# Patient Record
Sex: Female | Born: 1970 | Race: White | Hispanic: No | Marital: Married | Smoking: Never smoker
Health system: Southern US, Community
[De-identification: ages and names within clinical notes are randomized; demographics above are authoritative.]

## PROBLEM LIST (undated history)

## (undated) DIAGNOSIS — D869 Sarcoidosis, unspecified: Secondary | ICD-10-CM

## (undated) DIAGNOSIS — N809 Endometriosis, unspecified: Secondary | ICD-10-CM

## (undated) HISTORY — DX: Endometriosis, unspecified: N80.9

## (undated) HISTORY — PX: TONSILECTOMY, ADENOIDECTOMY, BILATERAL MYRINGOTOMY AND TUBES: SHX2538

---

## 2005-04-02 ENCOUNTER — Inpatient Hospital Stay: Payer: Self-pay

## 2007-09-13 ENCOUNTER — Inpatient Hospital Stay: Payer: Self-pay | Admitting: Unknown Physician Specialty

## 2015-02-25 ENCOUNTER — Emergency Department: Payer: BLUE CROSS/BLUE SHIELD

## 2015-02-25 ENCOUNTER — Encounter: Payer: Self-pay | Admitting: Emergency Medicine

## 2015-02-25 ENCOUNTER — Emergency Department
Admission: EM | Admit: 2015-02-25 | Discharge: 2015-02-25 | Disposition: A | Discharge status: Home / Self Care | Payer: BLUE CROSS/BLUE SHIELD | Attending: Emergency Medicine | Admitting: Emergency Medicine

## 2015-02-25 ENCOUNTER — Other Ambulatory Visit: Payer: Self-pay

## 2015-02-25 DIAGNOSIS — N390 Urinary tract infection, site not specified: Secondary | ICD-10-CM | POA: Diagnosis not present

## 2015-02-25 DIAGNOSIS — K529 Noninfective gastroenteritis and colitis, unspecified: Secondary | ICD-10-CM

## 2015-02-25 DIAGNOSIS — Z3202 Encounter for pregnancy test, result negative: Secondary | ICD-10-CM | POA: Insufficient documentation

## 2015-02-25 DIAGNOSIS — Z79899 Other long term (current) drug therapy: Secondary | ICD-10-CM | POA: Diagnosis not present

## 2015-02-25 DIAGNOSIS — A09 Infectious gastroenteritis and colitis, unspecified: Secondary | ICD-10-CM

## 2015-02-25 DIAGNOSIS — R1031 Right lower quadrant pain: Secondary | ICD-10-CM

## 2015-02-25 HISTORY — DX: Sarcoidosis, unspecified: D86.9

## 2015-02-25 LAB — URINALYSIS COMPLETE WITH MICROSCOPIC (ARMC ONLY)
Bilirubin Urine: NEGATIVE
GLUCOSE, UA: NEGATIVE mg/dL
Nitrite: NEGATIVE
PROTEIN: 30 mg/dL — AB
Specific Gravity, Urine: 1.019 (ref 1.005–1.030)
pH: 5 (ref 5.0–8.0)

## 2015-02-25 LAB — LIPASE, BLOOD: Lipase: 37 U/L (ref 22–51)

## 2015-02-25 LAB — COMPREHENSIVE METABOLIC PANEL
ALBUMIN: 4.7 g/dL (ref 3.5–5.0)
ALT: 12 U/L — ABNORMAL LOW (ref 14–54)
ANION GAP: 12 (ref 5–15)
AST: 17 U/L (ref 15–41)
Alkaline Phosphatase: 43 U/L (ref 38–126)
BUN: 9 mg/dL (ref 6–20)
CO2: 25 mmol/L (ref 22–32)
CREATININE: 0.8 mg/dL (ref 0.44–1.00)
Calcium: 9 mg/dL (ref 8.9–10.3)
Chloride: 100 mmol/L — ABNORMAL LOW (ref 101–111)
GFR calc Af Amer: >60 mL/min (ref >60)
GFR calc non Af Amer: >60 mL/min (ref >60)
Glucose, Bld: 99 mg/dL (ref 65–99)
POTASSIUM: 3.3 mmol/L — AB (ref 3.5–5.1)
SODIUM: 137 mmol/L (ref 135–145)
TOTAL PROTEIN: 7.8 g/dL (ref 6.5–8.1)
Total Bilirubin: 0.8 mg/dL (ref 0.3–1.2)

## 2015-02-25 LAB — CBC WITH DIFFERENTIAL/PLATELET
Basophils Absolute: 0 10*3/uL (ref 0–0.1)
Basophils Relative: 0 %
Eosinophils Absolute: 0 10*3/uL (ref 0–0.7)
Eosinophils Relative: 0 %
HCT: 39.4 % (ref 35.0–47.0)
Hemoglobin: 13.6 g/dL (ref 12.0–16.0)
LYMPHS PCT: 4 %
Lymphs Abs: 0.4 10*3/uL — ABNORMAL LOW (ref 1.0–3.6)
MCH: 31.6 pg (ref 26.0–34.0)
MCHC: 34.4 g/dL (ref 32.0–36.0)
MCV: 91.8 fL (ref 80.0–100.0)
Monocytes Absolute: 0.6 10*3/uL (ref 0.2–0.9)
Monocytes Relative: 6 %
NEUTROS ABS: 8.4 10*3/uL — AB (ref 1.4–6.5)
Neutrophils Relative %: 90 %
Platelets: 294 10*3/uL (ref 150–440)
RBC: 4.29 MIL/uL (ref 3.80–5.20)
RDW: 12.5 % (ref 11.5–14.5)
WBC: 9.4 10*3/uL (ref 3.6–11.0)

## 2015-02-25 LAB — POCT PREGNANCY, URINE: PREG TEST UR: NEGATIVE

## 2015-02-25 MED ORDER — ONDANSETRON HCL 4 MG/2ML IJ SOLN
INTRAMUSCULAR | Status: AC
  Filled 2015-02-25: qty 2

## 2015-02-25 MED ORDER — MORPHINE SULFATE 4 MG/ML IJ SOLN
4.0000 mg | Freq: Once | INTRAMUSCULAR | Status: AC
Start: 2015-02-25 — End: 2015-02-25
  Administered 2015-02-25: 4 mg via INTRAVENOUS

## 2015-02-25 MED ORDER — ONDANSETRON HCL 4 MG/2ML IJ SOLN
4.0000 mg | Freq: Once | INTRAMUSCULAR | Status: AC
  Administered 2015-02-25: 4 mg via INTRAVENOUS

## 2015-02-25 MED ORDER — SODIUM CHLORIDE 0.9 % IV SOLN
1000.0000 mL | Freq: Once | INTRAVENOUS | Status: AC
  Administered 2015-02-25: 1000 mL via INTRAVENOUS

## 2015-02-25 MED ORDER — CIPROFLOXACIN HCL 500 MG PO TABS: 500.0000 mg | ORAL_TABLET | Freq: Two times a day (BID) | ORAL | Status: DC

## 2015-02-25 MED ORDER — MORPHINE SULFATE 4 MG/ML IJ SOLN
INTRAMUSCULAR | Status: AC
  Filled 2015-02-25: qty 1

## 2015-02-25 MED ORDER — IOHEXOL 240 MG/ML SOLN
25.0000 mL | Freq: Once | INTRAMUSCULAR | Status: AC | PRN
  Administered 2015-02-25: 25 mL via ORAL

## 2015-02-25 MED ORDER — IOHEXOL 300 MG/ML  SOLN
100.0000 mL | Freq: Once | INTRAMUSCULAR | Status: AC | PRN
  Administered 2015-02-25: 100 mL via INTRAVENOUS

## 2015-02-25 MED ORDER — ONDANSETRON 4 MG PO TBDP
4.0000 mg | ORAL_TABLET | Freq: Once | ORAL | Status: AC
  Administered 2015-02-25: 4 mg via ORAL

## 2015-02-25 MED ORDER — SODIUM CHLORIDE 0.9 % IV BOLUS (SEPSIS)
1000.0000 mL | Freq: Once | INTRAVENOUS | Status: AC
  Administered 2015-02-25: 1000 mL via INTRAVENOUS

## 2015-02-25 MED ORDER — ONDANSETRON 4 MG PO TBDP
ORAL_TABLET | ORAL | Status: AC
  Filled 2015-02-25: qty 1

## 2015-02-25 MED ORDER — HYDROCODONE-ACETAMINOPHEN 5-325 MG PO TABS: 1.0000 | ORAL_TABLET | ORAL | Status: DC | PRN

## 2015-02-25 NOTE — Discharge Instructions (Signed)

## 2015-02-25 NOTE — ED Provider Notes (Signed)
[facility] Emergency Department Provider Note  ____________________________________________  Time seen: 5 PM  I have reviewed the triage vital signs and the nursing notes.   HISTORY  Chief Complaint Abdominal Pain      HPI Dawn Cook is a 44 y.o. female who presents with  right-sided abdominal pain. The pain began yesterday and was primarily in the right upper quadrant and was severe. It began after eating. She has had nausea and vomiting. No fevers or chills. Decreased appetite. The pain became more in the right lower quadrant today. No history of abdominal surgery.     Past Medical History  Diagnosis Date  . Sarcoidosis     There are no active problems to display for this patient.   History reviewed. No pertinent past surgical history.  Current Outpatient Rx  Name  Route  Sig  Dispense  Refill  . Norethin Ace-Eth Estrad-FE 1-20 MG-MCG(24) CHEW   Oral   Chew 1 tablet by mouth daily.           Allergies Review of patient's allergies indicates no known allergies.  No family history on file.  Social History History  Substance Use Topics  . Smoking status: Never Smoker   . Smokeless tobacco: Never Used  . Alcohol Use: Yes    Review of Systems  Constitutional: Negative for fever. Eyes: Negative for visual changes. ENT: Negative for sore throat. Cardiovascular: Negative for chest pain. Respiratory: Negative for shortness of breath. Gastrointestinal: Negative for abdominal pain, vomiting and diarrhea. Genitourinary: Negative for dysuria. Musculoskeletal: Negative for back pain. Skin: Negative for rash. Neurological: Negative for headaches, focal weakness or numbness. Psychiatric: No anxiety  10-point ROS otherwise negative.  ____________________________________________   PHYSICAL EXAM:  VITAL SIGNS: ED Triage Vitals  Enc Vitals Group     BP 02/25/15 1554 127/72 mmHg     Pulse Rate 02/25/15 1554 111     Resp  02/25/15 1554 16     Temp 02/25/15 1554 98.4 F (36.9 C)     Temp Source 02/25/15 1554 Oral     SpO2 02/25/15 1554 100 %     Weight 02/25/15 1554 127 lb (57.607 kg)     Height 02/25/15 1554 5' 10"  (1.778 m)     Head Cir --      Peak Flow --      Pain Score 02/25/15 1555 7     Pain Loc --      Pain Edu? --      Excl. in [place]? --      Constitutional: Alert and oriented. Well appearing and in no distress. Eyes: Conjunctivae are normal. PERRL. Normal extraocular movements. ENT   Head: Normocephalic and atraumatic.   Nose: No congestion/rhinnorhea.   Mouth/Throat: Mucous membranes are moist.   Neck: No stridor. Hematological/Lymphatic/Immunilogical: No cervical lymphadenopathy. Cardiovascular: Normal rate, regular rhythm. Normal and symmetric distal pulses are present in all extremities. No murmurs, rubs, or gallops. Respiratory: Normal respiratory effort without tachypnea nor retractions. Breath sounds are clear and equal bilaterally. No wheezes/rales/rhonchi. Gastrointestinal: Significant tenderness palpation at McBurney's point. No distention. There is no CVA tenderness. Genitourinary: deferred Musculoskeletal: Nontender with normal range of motion in all extremities. No joint effusions.  No lower extremity tenderness nor edema. Neurologic:  Normal speech and language. No gross focal neurologic deficits are appreciated. Speech is normal.  Skin:  Skin is warm, dry and intact. No rash noted. Psychiatric: Mood and affect are normal. Speech and behavior are normal. Patient exhibits appropriate  insight and judgment.  ____________________________________________    LABS (pertinent positives/negatives)  Unremarkable  ____________________________________________   EKG  ED ECG REPORT   Date: 02/25/2015  EKG Time: 4:20 PM  Rate: 103  Rhythm: sinus tachycardia  Axis: Normal  Intervals:none  ST&T Change: Normal   ____________________________________________     RADIOLOGY  CT abdomen and pelvis negative  ____________________________________________   PROCEDURES  Procedure(s) performed: None  Critical Care performed: None    ____________________________________________   INITIAL IMPRESSION / ASSESSMENT AND PLAN / ED COURSE  Pertinent labs & imaging results that were available during my care of the patient were reviewed by me and considered in my medical decision making (see chart for details).  Initial impression based on physical exam and history of present illness consistent for acute appendicitis versus biliary colic. We'll obtain CT abdomen and pelvis and give IV analgesics and antiemetics.  ----------------------------------------- 7:37 PM on 02/25/2015 -----------------------------------------  CT abdomen and pelvis normal. Patient feels well. We'll discharge with antibiotics for urinary tract infection and colitis. Patient will follow-up with PCP as needed. Return precautions given  ____________________________________________   FINAL CLINICAL IMPRESSION(S) / ED DIAGNOSES  Final diagnoses:  Colitis  Right lower quadrant abdominal pain  Traveler's diarrhea  Urinary tract infection without hematuria, site unspecified     Lavonia Drafts, MD 02/25/15 2011

## 2015-02-25 NOTE — ED Notes (Signed)
Pt transported to CT ?

## 2015-02-25 NOTE — ED Notes (Signed)
Patient to ER with c/o RUQ abdominal pain that is dull intermittently with sharpness at times.

## 2017-07-04 ENCOUNTER — Ambulatory Visit
Admission: EM | Admit: 2017-07-04 | Discharge: 2017-07-04 | Disposition: A | Discharge status: Home / Self Care | Payer: BLUE CROSS/BLUE SHIELD

## 2017-07-13 ENCOUNTER — Telehealth: Payer: Self-pay

## 2017-07-13 ENCOUNTER — Other Ambulatory Visit: Payer: Self-pay | Admitting: Obstetrics & Gynecology

## 2017-07-13 MED ORDER — NORETHIN ACE-ETH ESTRAD-FE 1-20 MG-MCG(24) PO CHEW: 1.0000 | CHEWABLE_TABLET | Freq: Every day | ORAL | 0 refills | Status: DC

## 2017-07-13 NOTE — Telephone Encounter (Signed)
Pt aware bc refill eRx'd.

## 2017-07-13 NOTE — Telephone Encounter (Signed)
Pt needs refill of birthcontrol by tomorrow. [phone]

## 2017-07-13 NOTE — Telephone Encounter (Signed)
Pt is schedule 07/31/17 with JC

## 2017-07-30 ENCOUNTER — Encounter: Payer: Self-pay | Admitting: Obstetrics and Gynecology

## 2017-07-30 ENCOUNTER — Ambulatory Visit (INDEPENDENT_AMBULATORY_CARE_PROVIDER_SITE_OTHER): Payer: BLUE CROSS/BLUE SHIELD | Admitting: Obstetrics and Gynecology

## 2017-07-30 VITALS — BP 124/78 | HR 59 | Ht 70.0 in | Wt 137.1 lb

## 2017-07-30 DIAGNOSIS — Z01419 Encounter for gynecological examination (general) (routine) without abnormal findings: Secondary | ICD-10-CM | POA: Diagnosis not present

## 2017-07-30 DIAGNOSIS — Z808 Family history of malignant neoplasm of other organs or systems: Secondary | ICD-10-CM

## 2017-07-30 DIAGNOSIS — Z0000 Encounter for general adult medical examination without abnormal findings: Secondary | ICD-10-CM

## 2017-07-30 DIAGNOSIS — Z8049 Family history of malignant neoplasm of other genital organs: Secondary | ICD-10-CM | POA: Diagnosis not present

## 2017-07-30 DIAGNOSIS — Z800 Family history of malignant neoplasm of digestive organs: Secondary | ICD-10-CM | POA: Diagnosis not present

## 2017-07-30 DIAGNOSIS — Z803 Family history of malignant neoplasm of breast: Secondary | ICD-10-CM

## 2017-07-30 MED ORDER — JUNEL FE 1/20 1-20 MG-MCG PO TABS: 1.0000 | ORAL_TABLET | Freq: Every day | ORAL | 3 refills | Status: DC

## 2017-07-30 NOTE — Progress Notes (Signed)
HPI:      [name] is a 46 y.o. [phone] who LMP was Patient's last menstrual period was 07/02/2017 (approximate).  Subjective:   She presents today for her annual examination.  She is currently taking OCPs having normal regular cycles and would like to continue. She has no complaints. Upon questioning she does have a significant family history for cancer. Close relatives have brain cancer, endometrial cancer, breast cancer, and colon cancer.    Hx: The following portions of the patient's history were reviewed and updated as appropriate:             She  has a past medical history of Endometriosis and Sarcoidosis. She  does not have a problem list on file. She  has no past surgical history on file. Her family history includes Breast cancer in her mother; Cancer in her maternal grandmother, maternal uncle, paternal aunt, paternal grandfather, paternal grandmother, and paternal uncle; Endometrial cancer in her mother. She  reports that she has never smoked. She has never used smokeless tobacco. She reports that she drinks alcohol. She reports that she does not use drugs. She has No Known Allergies.       Review of Systems:  Review of Systems  Constitutional: Denied constitutional symptoms, night sweats, recent illness, fatigue, fever, insomnia and weight loss.  Eyes: Denied eye symptoms, eye pain, photophobia, vision change and visual disturbance.  Ears/Nose/Throat/Neck: Denied ear, nose, throat or neck symptoms, hearing loss, nasal discharge, sinus congestion and sore throat.  Cardiovascular: Denied cardiovascular symptoms, arrhythmia, chest pain/pressure, edema, exercise intolerance, orthopnea and palpitations.  Respiratory: Denied pulmonary symptoms, asthma, pleuritic pain, productive sputum, cough, dyspnea and wheezing.  Gastrointestinal: Denied, gastro-esophageal reflux, melena, nausea and vomiting.  Genitourinary: Denied genitourinary symptoms including symptomatic vaginal  discharge, pelvic relaxation issues, and urinary complaints.  Musculoskeletal: Denied musculoskeletal symptoms, stiffness, swelling, muscle weakness and myalgia.  Dermatologic: Denied dermatology symptoms, rash and scar.  Neurologic: Denied neurology symptoms, dizziness, headache, neck pain and syncope.  Psychiatric: Denied psychiatric symptoms, anxiety and depression.  Endocrine: Denied endocrine symptoms including hot flashes and night sweats.   Meds:   No current outpatient prescriptions on file prior to visit.   No current facility-administered medications on file prior to visit.     Objective:     Vitals:   07/30/17 1027  BP: 124/78  Pulse: (!) 59              Physical examination General NAD, Conversant  HEENT Atraumatic; Op clear with mmm.  Normo-cephalic. Pupils reactive. Anicteric sclerae  Thyroid/Neck Smooth without nodularity or enlargement. Normal ROM.  Neck Supple.  Skin No rashes, lesions or ulceration. Normal palpated skin turgor. No nodularity.  Breasts: No masses or discharge.  Symmetric.  No axillary adenopathy.  Lungs: Clear to auscultation.No rales or wheezes. Normal Respiratory effort, no retractions.  Heart: NSR.  No murmurs or rubs appreciated. No periferal edema  Abdomen: Soft.  Non-tender.  No masses.  No HSM. No hernia  Extremities: Moves all appropriately.  Normal ROM for age. No lymphadenopathy.  Neuro: Oriented to PPT.  Normal mood. Normal affect.     Pelvic:   Vulva: Normal appearance.  No lesions.  Vagina: No lesions or abnormalities noted.  Support: Normal pelvic support.  Urethra No masses tenderness or scarring.  Meatus Normal size without lesions or prolapse.  Cervix: Normal appearance.  No lesions.  Anus: Normal exam.  No lesions.  Perineum: Normal exam.  No lesions.  Bimanual   Uterus: Normal size.  Non-tender.  Mobile.  AV.  Adnexae: No masses.  Non-tender to palpation.  Cul-de-sac: Negative for abnormality.       Assessment:    [id number] There are no active problems to display for this patient.    1. Encounter for annual physical exam   2. Family history of brain cancer   3. Family history of breast cancer   4. Family history of colon cancer   5. Family history of genital cancer     Patient with no current problems but a significant family history of cancer.   Plan:            1.  Basic Screening Recommendations The basic screening recommendations for asymptomatic women were discussed with the patient during her visit.  The age-appropriate recommendations were discussed with her and the rational for the tests reviewed.  When I am informed by the patient that another primary care physician has previously obtained the age-appropriate tests and they are up-to-date, only outstanding tests are ordered and referrals given as necessary.  Abnormal results of tests will be discussed with her when all of her results are completed. Pap performed  2.  We have discussed genetic testing for cancer genes and I have given her several pamphlets of literature to review and discuss with her insurance company. She will inform us when she has reached a decision. 3.  Mammogram ordered  Orders Orders Placed This Encounter  Procedures  . MM DIGITAL SCREENING BILATERAL  . Glucose, random  . TSH  . Lipid panel     Meds ordered this encounter  Medications  . JUNEL FE 1/20 1-20 MG-MCG tablet    Sig: Take 1 tablet by mouth daily.    Dispense:  3 Package    Refill:  3        F/U  No Follow-up on file.  Finis Bud, M.D. 07/30/2017 11:44 AM

## 2017-07-30 NOTE — Addendum Note (Signed)
Addended by: Raliegh Ip on: 07/30/2017 12:48 PM   Modules accepted: Orders

## 2017-07-31 ENCOUNTER — Ambulatory Visit: Payer: Self-pay | Admitting: Maternal Newborn

## 2017-08-03 LAB — PAP IG AND HPV HIGH-RISK
HPV, high-risk: NEGATIVE
PAP Smear Comment: 0

## 2017-08-06 ENCOUNTER — Other Ambulatory Visit: Payer: BLUE CROSS/BLUE SHIELD

## 2017-08-07 LAB — LIPID PANEL
CHOL/HDL RATIO: 3.6 ratio (ref 0.0–4.4)
Cholesterol, Total: 196 mg/dL (ref 100–199)
HDL: 54 mg/dL (ref >39)
LDL CALC: 125 mg/dL — AB (ref 0–99)
Triglycerides: 85 mg/dL (ref 0–149)
VLDL Cholesterol Cal: 17 mg/dL (ref 5–40)

## 2017-08-07 LAB — GLUCOSE, RANDOM: GLUCOSE: 93 mg/dL (ref 65–99)

## 2017-08-07 LAB — TSH: TSH: 2.87 u[IU]/mL (ref 0.450–4.500)

## 2017-08-11 ENCOUNTER — Telehealth: Payer: Self-pay

## 2017-08-11 NOTE — Telephone Encounter (Signed)
-----   Message from Harlin Heys, MD sent at 08/04/2017  4:11 PM EDT ----- Negative Pap and HPV

## 2017-08-11 NOTE — Telephone Encounter (Signed)
Message left on pts voicemail- neg test results per provider

## 2018-04-12 ENCOUNTER — Encounter: Payer: Self-pay | Admitting: Urology

## 2018-04-12 ENCOUNTER — Ambulatory Visit: Payer: BLUE CROSS/BLUE SHIELD | Admitting: Urology

## 2018-04-12 VITALS — BP 114/75 | HR 70 | Ht 70.0 in | Wt 136.2 lb

## 2018-04-12 DIAGNOSIS — R319 Hematuria, unspecified: Secondary | ICD-10-CM | POA: Diagnosis not present

## 2018-04-12 LAB — URINALYSIS, COMPLETE
Bilirubin, UA: NEGATIVE
Glucose, UA: NEGATIVE
Ketones, UA: NEGATIVE
LEUKOCYTES UA: NEGATIVE
Nitrite, UA: NEGATIVE
Protein, UA: NEGATIVE
Specific Gravity, UA: <1.005 — ABNORMAL LOW (ref 1.005–1.030)
Urobilinogen, Ur: 0.2 mg/dL (ref 0.2–1.0)
pH, UA: 6.5 (ref 5.0–7.5)

## 2018-04-12 LAB — MICROSCOPIC EXAMINATION

## 2018-04-12 NOTE — Progress Notes (Signed)
04/12/2018 1:26 PM   Dawn Cook 04-06-1971 [id number]  Referring provider: Clinic, [place] [address] [place], [place] [address]  Chief Complaint  Patient presents with  . Hematuria    HPI I was consulted to assist the patient's microscopic hematuria.  She voids every 2 or 3 hours.  She has uncommon stress incontinence not wearing a pad.  She gets up once a night.  She has never smoked.  She does not take daily aspirin or blood thinners.  She has not had a hysterectomy.  She is not a diabetic.  Bowel function normal.  Presentation has not been medically treated.  She has not had previous GU surgery or kidney stones  Modifying factors: There are no other modifying factors  Associated signs and symptoms: There are no other associated signs and symptoms Aggravating and relieving factors: There are no other aggravating or relieving factors Severity: Moderate Duration: Persistent      PMH: Past Medical History:  Diagnosis Date  . Endometriosis   . Sarcoidosis     Surgical History: Past Surgical History:  Procedure Laterality Date  . TONSILECTOMY, ADENOIDECTOMY, BILATERAL MYRINGOTOMY AND TUBES      Home Medications:  Allergies as of 04/12/2018   No Known Allergies     Medication List        Accurate as of 04/12/18  1:26 PM. Always use your most recent med list.          azelastine 0.1 % nasal spray Commonly known as:  ASTELIN Place into the nose.   Fish Oil 1000 MG Caps Take by mouth.   JUNEL FE 1/20 1-20 MG-MCG tablet Generic drug:  norethindrone-ethinyl estradiol Take 1 tablet by mouth daily.       Allergies: No Known Allergies  Family History: Family History  Problem Relation Age of Onset  . Breast cancer Mother   . Endometrial cancer Mother   . Cancer Maternal Uncle   . Cancer Paternal Aunt   . Cancer Paternal Uncle   . Cancer Maternal Grandmother   . Cancer Paternal Grandmother   . Cancer Paternal Grandfather   . Bladder  Cancer Neg Hx   . Kidney cancer Neg Hx     Social History:  reports that she has never smoked. She has never used smokeless tobacco. She reports that she drinks alcohol. She reports that she does not use drugs.  ROS: UROLOGY Frequent Urination?: No Hard to postpone urination?: No Burning/pain with urination?: No Get up at night to urinate?: Yes Leakage of urine?: No Urine stream starts and stops?: No Trouble starting stream?: No Do you have to strain to urinate?: No Blood in urine?: Yes Urinary tract infection?: No Sexually transmitted disease?: No Injury to kidneys or bladder?: No Painful intercourse?: Yes Weak stream?: No Currently pregnant?: No Vaginal bleeding?: No Last menstrual period?: 04/05/18  Gastrointestinal Nausea?: No Vomiting?: No Indigestion/heartburn?: No Diarrhea?: No Constipation?: No  Constitutional Fever: No Night sweats?: No Weight loss?: No Fatigue?: No  Skin Skin rash/lesions?: No Itching?: No  Eyes Blurred vision?: No Double vision?: No  Ears/Nose/Throat Sore throat?: No Sinus problems?: No  Hematologic/Lymphatic Swollen glands?: No Easy bruising?: No  Cardiovascular Leg swelling?: No Chest pain?: No  Respiratory Cough?: No Shortness of breath?: No  Endocrine Excessive thirst?: No  Musculoskeletal Back pain?: No Joint pain?: No  Neurological Headaches?: No Dizziness?: No  Psychologic Depression?: No Anxiety?: No  Physical Exam: BP 114/75 (BP Location: Right Arm, Patient Position: Sitting, Cuff Size:  Normal)   Pulse 70   Ht 5' 10"  (1.778 m)   Wt 136 lb 3.2 oz (61.8 kg)   BMI 19.54 kg/m   Constitutional:  Alert and oriented, No acute distress. HEENT: [place] AT, moist mucus membranes.  Trachea midline, no masses. Cardiovascular: No clubbing, cyanosis, or edema. Respiratory: Normal respiratory effort, no increased work of breathing. GI: Abdomen is soft, nontender, nondistended, no abdominal masses GU: No CVA  tenderness.  No bladder tenderness Skin: No rashes, bruises or suspicious lesions. Lymph: No cervical or inguinal adenopathy. Neurologic: Grossly intact, no focal deficits, moving all 4 extremities. Psychiatric: Normal mood and affect.  Laboratory Data: Lab Results  Component Value Date   WBC 9.4 02/25/2015   HGB 13.6 02/25/2015   HCT 39.4 02/25/2015   MCV 91.8 02/25/2015   PLT 294 02/25/2015    Lab Results  Component Value Date   CREATININE 0.80 02/25/2015    No results found for: PSA  No results found for: TESTOSTERONE  No results found for: HGBA1C  Urinalysis    Component Value Date/Time   COLORURINE YELLOW (A) 02/25/2015 1635   APPEARANCEUR HAZY (A) 02/25/2015 1635   LABSPEC 1.019 02/25/2015 1635   PHURINE 5.0 02/25/2015 1635   GLUCOSEU NEGATIVE 02/25/2015 1635   HGBUR 2+ (A) 02/25/2015 1635   BILIRUBINUR NEGATIVE 02/25/2015 1635   KETONESUR 2+ (A) 02/25/2015 1635   PROTEINUR 30 (A) 02/25/2015 1635   NITRITE NEGATIVE 02/25/2015 1635   LEUKOCYTESUR 1+ (A) 02/25/2015 1635    Pertinent Imaging: None  Assessment & Plan: Patient will return with CT scan and for cystoscopy.  She has mild stable frequency and nocturia  1. Hematuria, unspecified type  - Urinalysis, Complete   No follow-ups on file.  Reece Packer, MD  [facility] [address], [place], [place] [address] [phone]

## 2018-04-26 ENCOUNTER — Encounter: Payer: Self-pay | Admitting: Urology

## 2018-04-26 ENCOUNTER — Ambulatory Visit: Payer: BLUE CROSS/BLUE SHIELD | Admitting: Urology

## 2018-04-26 VITALS — BP 136/81 | HR 76 | Ht 70.0 in | Wt 135.0 lb

## 2018-04-26 DIAGNOSIS — R319 Hematuria, unspecified: Secondary | ICD-10-CM | POA: Diagnosis not present

## 2018-04-26 LAB — URINALYSIS, COMPLETE
BILIRUBIN UA: NEGATIVE
Glucose, UA: NEGATIVE
Ketones, UA: NEGATIVE
Leukocytes, UA: NEGATIVE
Nitrite, UA: NEGATIVE
PH UA: 5.5 (ref 5.0–7.5)
Protein, UA: NEGATIVE
Specific Gravity, UA: 1.01 (ref 1.005–1.030)
Urobilinogen, Ur: 0.2 mg/dL (ref 0.2–1.0)

## 2018-04-26 LAB — MICROSCOPIC EXAMINATION: WBC, UA: NONE SEEN /hpf (ref 0–5)

## 2018-04-26 MED ORDER — CIPROFLOXACIN HCL 500 MG PO TABS
500.0000 mg | ORAL_TABLET | Freq: Once | ORAL | Status: AC
  Administered 2018-04-26: 500 mg via ORAL

## 2018-04-26 MED ORDER — LIDOCAINE HCL URETHRAL/MUCOSAL 2 % EX GEL
1.0000 "application " | Freq: Once | CUTANEOUS | Status: AC
  Administered 2018-04-26: 1 via URETHRAL

## 2018-04-26 NOTE — Progress Notes (Signed)
04/26/2018 2:46 PM   Dawn Cook May 05, 1971 [id number]  Referring provider: Clinic, [facility] [place],  [address]  Chief Complaint  Patient presents with  . Cysto    HPI: I was consulted to assist the patient's microscopic hematuria.  She voids every 2 or 3 hours.  She has uncommon stress incontinence not wearing a pad.  She gets up once a night.  She has never smoked.  She does not take daily aspirin or blood thinners.  Today Frequency stable.  Clinically not infected.  CT scan normal performed with contrast On pelvic examination no prolapse or stress incontinence  Cystoscopy: After verbal and written consent patient underwent flexible cystoscopy under sterile technique.  Bladder mucosa and trigone were normal.  There is no stitch or foreign body or carcinoma.  She tolerated procedure very well.  There was no abnormal Efflux from the ureter   PMH: Past Medical History:  Diagnosis Date  . Endometriosis   . Sarcoidosis     Surgical History: Past Surgical History:  Procedure Laterality Date  . TONSILECTOMY, ADENOIDECTOMY, BILATERAL MYRINGOTOMY AND TUBES      Home Medications:  Allergies as of 04/26/2018   No Known Allergies     Medication List        Accurate as of 04/26/18  2:46 PM. Always use your most recent med list.          azelastine 0.1 % nasal spray Commonly known as:  ASTELIN Place into the nose.   Fish Oil 1000 MG Caps Take by mouth.   JUNEL FE 1/20 1-20 MG-MCG tablet Generic drug:  norethindrone-ethinyl estradiol Take 1 tablet by mouth daily.       Allergies: No Known Allergies  Family History: Family History  Problem Relation Age of Onset  . Breast cancer Mother   . Endometrial cancer Mother   . Cancer Maternal Uncle   . Cancer Paternal Aunt   . Cancer Paternal Uncle   . Cancer Maternal Grandmother   . Cancer Paternal Grandmother   . Cancer Paternal Grandfather   . Bladder Cancer Neg Hx   . Kidney  cancer Neg Hx     Social History:  reports that she has never smoked. She has never used smokeless tobacco. She reports that she drinks alcohol. She reports that she does not use drugs.  ROS:                                        Physical Exam: BP 136/81 (BP Location: Right Arm, Patient Position: Sitting, Cuff Size: Normal)   Pulse 76   Ht 5' 10"  (1.778 m)   Wt 135 lb (61.2 kg)   BMI 19.37 kg/m     Laboratory Data: Lab Results  Component Value Date   WBC 9.4 02/25/2015   HGB 13.6 02/25/2015   HCT 39.4 02/25/2015   MCV 91.8 02/25/2015   PLT 294 02/25/2015    Lab Results  Component Value Date   CREATININE 0.80 02/25/2015    No results found for: PSA  No results found for: TESTOSTERONE  No results found for: HGBA1C  Urinalysis    Component Value Date/Time   COLORURINE YELLOW (A) 02/25/2015 1635   APPEARANCEUR Clear 04/12/2018 1306   LABSPEC 1.019 02/25/2015 1635   PHURINE 5.0 02/25/2015 1635   GLUCOSEU Negative 04/12/2018 1306   HGBUR 2+ (A) 02/25/2015 1635  BILIRUBINUR Negative 04/12/2018 1306   KETONESUR 2+ (A) 02/25/2015 1635   PROTEINUR Negative 04/12/2018 1306   PROTEINUR 30 (A) 02/25/2015 1635   NITRITE Negative 04/12/2018 1306   NITRITE NEGATIVE 02/25/2015 1635   LEUKOCYTESUR Negative 04/12/2018 1306    Pertinent Imaging: As noted  Assessment & Plan: Benign microscopic hematuria.  I will see her as needed  1. Hematuria, unspecified type   - Urinalysis, Complete - ciprofloxacin (CIPRO) tablet 500 mg - lidocaine (XYLOCAINE) 2 % jelly 1 application   No follow-ups on file.  Reece Packer, MD  [facility] [address] [place], [place] [address] [phone]

## 2018-05-31 ENCOUNTER — Other Ambulatory Visit: Payer: Self-pay | Admitting: Obstetrics and Gynecology

## 2018-07-05 ENCOUNTER — Other Ambulatory Visit: Payer: Self-pay | Admitting: Obstetrics and Gynecology

## 2018-07-06 ENCOUNTER — Telehealth: Payer: Self-pay | Admitting: Obstetrics and Gynecology

## 2018-07-06 NOTE — Telephone Encounter (Signed)
The patient is asking if she can get a refill of her [place] Fe as she is completely out and the pharmacy at [facility] will not refill even though there are 2 refills on her 06/01/18 script.  She states she needs it today if at all possible, please advise, thanks.

## 2018-07-06 NOTE — Telephone Encounter (Signed)
Pt aware per kim at [facility] her rx is ready.   AE appt made for 07/2018.

## 2018-08-02 ENCOUNTER — Other Ambulatory Visit: Payer: Self-pay | Admitting: Obstetrics and Gynecology

## 2018-08-11 ENCOUNTER — Ambulatory Visit (INDEPENDENT_AMBULATORY_CARE_PROVIDER_SITE_OTHER): Payer: BLUE CROSS/BLUE SHIELD | Admitting: Obstetrics and Gynecology

## 2018-08-11 ENCOUNTER — Other Ambulatory Visit: Payer: Self-pay | Admitting: Obstetrics and Gynecology

## 2018-08-11 ENCOUNTER — Encounter: Payer: Self-pay | Admitting: Obstetrics and Gynecology

## 2018-08-11 VITALS — BP 107/70 | HR 81 | Ht 70.0 in | Wt 138.0 lb

## 2018-08-11 DIAGNOSIS — Z0000 Encounter for general adult medical examination without abnormal findings: Secondary | ICD-10-CM

## 2018-08-11 DIAGNOSIS — Z1231 Encounter for screening mammogram for malignant neoplasm of breast: Secondary | ICD-10-CM

## 2018-08-11 MED ORDER — NORETHIN ACE-ETH ESTRAD-FE 1-20 MG-MCG PO TABS: 1.0000 | ORAL_TABLET | Freq: Every day | ORAL | 3 refills | Status: DC

## 2018-08-11 NOTE — Progress Notes (Signed)
HPI:      [name] is a 47 y.o. [phone] who LMP was Patient's last menstrual period was 07/28/2018 (exact date).  Subjective:   She presents today for her annual examination.  She has no complaints.  She is taking OCPs and having regular cycles.  She would like to continue. She did not get her mammogram as previously prescribed last year but would like one this year.    Hx: The following portions of the patient's history were reviewed and updated as appropriate:             She  has a past medical history of Endometriosis and Sarcoidosis. She does not have a problem list on file. She  has a past surgical history that includes Tonsilectomy, adenoidectomy, bilateral myringotomy and tubes. Her family history includes Breast cancer in her mother; Cancer in her maternal grandmother, maternal uncle, paternal aunt, paternal grandfather, paternal grandmother, and paternal uncle; Endometrial cancer in her mother. She  reports that she has never smoked. She has never used smokeless tobacco. She reports that she drinks alcohol. She reports that she does not use drugs. She has a current medication list which includes the following prescription(s): norethindrone-ethinyl estradiol, azelastine, and fish oil. She has No Known Allergies.       Review of Systems:  Review of Systems  Constitutional: Denied constitutional symptoms, night sweats, recent illness, fatigue, fever, insomnia and weight loss.  Eyes: Denied eye symptoms, eye pain, photophobia, vision change and visual disturbance.  Ears/Nose/Throat/Neck: Denied ear, nose, throat or neck symptoms, hearing loss, nasal discharge, sinus congestion and sore throat.  Cardiovascular: Denied cardiovascular symptoms, arrhythmia, chest pain/pressure, edema, exercise intolerance, orthopnea and palpitations.  Respiratory: Denied pulmonary symptoms, asthma, pleuritic pain, productive sputum, cough, dyspnea and wheezing.  Gastrointestinal: Denied,  gastro-esophageal reflux, melena, nausea and vomiting.  Genitourinary: Denied genitourinary symptoms including symptomatic vaginal discharge, pelvic relaxation issues, and urinary complaints.  Musculoskeletal: Denied musculoskeletal symptoms, stiffness, swelling, muscle weakness and myalgia.  Dermatologic: Denied dermatology symptoms, rash and scar.  Neurologic: Denied neurology symptoms, dizziness, headache, neck pain and syncope.  Psychiatric: Denied psychiatric symptoms, anxiety and depression.  Endocrine: Denied endocrine symptoms including hot flashes and night sweats.   Meds:   Current Outpatient Medications on File Prior to Visit  Medication Sig Dispense Refill  . azelastine (ASTELIN) 0.1 % nasal spray Place into the nose.    . Omega-3 Fatty Acids (FISH OIL) 1000 MG CAPS Take by mouth.     No current facility-administered medications on file prior to visit.     Objective:     Vitals:   08/11/18 1335  BP: 107/70  Pulse: 81              Physical examination General NAD, Conversant  HEENT Atraumatic; Op clear with mmm.  Normo-cephalic. Pupils reactive. Anicteric sclerae  Thyroid/Neck Smooth without nodularity or enlargement. Normal ROM.  Neck Supple.  Skin No rashes, lesions or ulceration. Normal palpated skin turgor. No nodularity.  Breasts: No masses or discharge.  Symmetric.  No axillary adenopathy.  Lungs: Clear to auscultation.No rales or wheezes. Normal Respiratory effort, no retractions.  Heart: NSR.  No murmurs or rubs appreciated. No periferal edema  Abdomen: Soft.  Non-tender.  No masses.  No HSM. No hernia  Extremities: Moves all appropriately.  Normal ROM for age. No lymphadenopathy.  Neuro: Oriented to PPT.  Normal mood. Normal affect.     Pelvic:   Vulva: Normal appearance.  No lesions.  Vagina:  No lesions or abnormalities noted.  Support: Normal pelvic support.  Urethra No masses tenderness or scarring.  Meatus Normal size without lesions or prolapse.   Cervix: Normal appearance.  No lesions.  Anus: Normal exam.  No lesions.  Perineum: Normal exam.  No lesions.        Bimanual   Uterus: Normal size.  Non-tender.  Mobile.  AV.  Adnexae: No masses.  Non-tender to palpation.  Cul-de-sac: Negative for abnormality.      Assessment:    [id number] There are no active problems to display for this patient.    1. Encounter for annual physical exam     Normal exam   Plan:            1.  Basic Screening Recommendations The basic screening recommendations for asymptomatic women were discussed with the patient during her visit.  The age-appropriate recommendations were discussed with her and the rational for the tests reviewed.  When I am informed by the patient that another primary care physician has previously obtained the age-appropriate tests and they are up-to-date, only outstanding tests are ordered and referrals given as necessary.  Abnormal results of tests will be discussed with her when all of her results are completed. As last Pap was normal with negative HPV will do Pap every 3 years. Lab work ordered 2.  Mammogram ordered 3.  Continue OCPs Orders Orders Placed This Encounter  Procedures  . MM DIGITAL SCREENING BILATERAL  . Basic metabolic panel  . Lipid panel  . TSH  . Glucose, random     Meds ordered this encounter  Medications  . norethindrone-ethinyl estradiol (BLISOVI FE 1/20) 1-20 MG-MCG tablet    Sig: Take 1 tablet by mouth daily.    Dispense:  3 Package    Refill:  3    PT REQUEST REFILLS PLEASE        F/U  Return in about 1 year (around 08/12/2019) for Annual Physical.  Finis Bud, M.D. 08/11/2018 1:59 PM

## 2018-08-11 NOTE — Progress Notes (Signed)
Pt here today for annual exam. Pt is doing well and has no concerns. She would like a refill of her BCPs.  LMP 07/28/18, Last pap 07/2017.

## 2018-08-31 ENCOUNTER — Other Ambulatory Visit: Payer: BLUE CROSS/BLUE SHIELD

## 2018-09-01 ENCOUNTER — Ambulatory Visit
Admission: RE | Admit: 2018-09-01 | Discharge: 2018-09-01 | Disposition: A | Discharge status: Home / Self Care | Payer: BLUE CROSS/BLUE SHIELD | Source: Ambulatory Visit | Attending: Obstetrics and Gynecology | Admitting: Obstetrics and Gynecology

## 2018-09-01 DIAGNOSIS — Z1231 Encounter for screening mammogram for malignant neoplasm of breast: Secondary | ICD-10-CM | POA: Insufficient documentation

## 2018-09-08 ENCOUNTER — Other Ambulatory Visit: Payer: BLUE CROSS/BLUE SHIELD

## 2018-09-09 LAB — BASIC METABOLIC PANEL
BUN / CREAT RATIO: 10 (ref 9–23)
BUN: 8 mg/dL (ref 6–24)
CHLORIDE: 102 mmol/L (ref 96–106)
CO2: 21 mmol/L (ref 20–29)
Calcium: 9.2 mg/dL (ref 8.7–10.2)
Creatinine, Ser: 0.77 mg/dL (ref 0.57–1.00)
GFR calc non Af Amer: 93 mL/min/{1.73_m2} (ref >59)
GFR, EST AFRICAN AMERICAN: 107 mL/min/{1.73_m2} (ref >59)
GLUCOSE: 77 mg/dL (ref 65–99)
Potassium: 4.5 mmol/L (ref 3.5–5.2)
SODIUM: 140 mmol/L (ref 134–144)

## 2018-09-09 LAB — LIPID PANEL
CHOLESTEROL TOTAL: 211 mg/dL — AB (ref 100–199)
Chol/HDL Ratio: 4 ratio (ref 0.0–4.4)
HDL: 53 mg/dL (ref >39)
LDL CALC: 132 mg/dL — AB (ref 0–99)
Triglycerides: 131 mg/dL (ref 0–149)
VLDL Cholesterol Cal: 26 mg/dL (ref 5–40)

## 2018-09-09 LAB — TSH: TSH: 2.29 u[IU]/mL (ref 0.450–4.500)

## 2018-09-13 ENCOUNTER — Telehealth: Payer: Self-pay

## 2018-09-13 NOTE — Telephone Encounter (Signed)
LVM for pt to return call regarding lab results.

## 2018-09-13 NOTE — Telephone Encounter (Signed)
Pt returned call. Informed her of lab results stating her cholesterol is elevated. Per DJE, advised pt to follow up with PCP regarding cholesterol levels.

## 2018-09-15 ENCOUNTER — Other Ambulatory Visit: Payer: Self-pay | Admitting: Obstetrics and Gynecology

## 2018-12-09 ENCOUNTER — Telehealth: Payer: Self-pay | Admitting: Obstetrics and Gynecology

## 2018-12-09 MED ORDER — NORETHIN ACE-ETH ESTRAD-FE 1-20 MG-MCG PO TABS: 1.0000 | ORAL_TABLET | Freq: Every day | ORAL | 1 refills | Status: DC

## 2018-12-09 NOTE — Telephone Encounter (Signed)
The patient called and stated that she needs a medication refill of her prescription norethindrone-ethinyl estradiol (BLISOVI FE 1/20) 1-20 MG-MCG tablet [10735] (Birth Control) The [facility] script refill requests. I advised the patient that a message would be sent back to her nurse/provider. No other information was disclosed. Please advise.

## 2018-12-09 NOTE — Telephone Encounter (Signed)
Prescription has been sent to the pharmacy and LM for patient.

## 2019-05-10 ENCOUNTER — Telehealth: Payer: Self-pay | Admitting: Obstetrics and Gynecology

## 2019-05-10 NOTE — Telephone Encounter (Signed)
Patient called requesting a referral to physical therapy for pelvic floor therapy. Thanks

## 2019-05-10 NOTE — Telephone Encounter (Signed)
Patient has been having pelvic pain for years. She is wanting a referral to [facility] pelvic therapy. Please advise on referral or does she need to be seen.

## 2019-05-11 NOTE — Telephone Encounter (Signed)
LM for patient to return call to get info on where the referral should go.

## 2019-05-12 NOTE — Telephone Encounter (Signed)
I spoke with the patient. [place] are in Network with her insurance. Patient stated which ever Dr Amalia Hailey thinks is best. Thanks

## 2019-05-16 ENCOUNTER — Telehealth: Payer: Self-pay | Admitting: Obstetrics and Gynecology

## 2019-05-16 NOTE — Telephone Encounter (Signed)
The patient called and stated that a referral should have been placed the patient is requesting a follow up call to check the status of things. Pt aware [facility] and nurse are out of office until tomorrow. Please advise.

## 2019-05-17 ENCOUNTER — Other Ambulatory Visit: Payer: Self-pay | Admitting: Surgical

## 2019-05-17 MED ORDER — NORETHIN ACE-ETH ESTRAD-FE 1-20 MG-MCG PO TABS: 1.0000 | ORAL_TABLET | Freq: Every day | ORAL | 1 refills | Status: DC

## 2019-05-18 NOTE — Telephone Encounter (Signed)
The patient called and stated that she never heard anything back from the phone message she had sent back Monday. Pt is requesting a call back today if possible. Please advise.

## 2019-05-20 ENCOUNTER — Other Ambulatory Visit: Payer: Self-pay | Admitting: Surgical

## 2019-05-20 DIAGNOSIS — R102 Pelvic and perineal pain: Secondary | ICD-10-CM

## 2019-05-20 NOTE — Telephone Encounter (Signed)
Tried to call the patient 05/19/2019 to see where she wanted referral to go. She is wanting to go to [facility]. Can you please schedule.

## 2019-05-25 NOTE — Telephone Encounter (Signed)
This has been taken care of.

## 2019-06-10 ENCOUNTER — Encounter: Payer: Self-pay | Admitting: Physical Therapy

## 2019-06-10 ENCOUNTER — Ambulatory Visit: Payer: BLUE CROSS/BLUE SHIELD | Attending: Sports Medicine | Admitting: Physical Therapy

## 2019-06-10 ENCOUNTER — Other Ambulatory Visit: Payer: Self-pay

## 2019-06-10 DIAGNOSIS — R102 Pelvic and perineal pain: Secondary | ICD-10-CM | POA: Diagnosis present

## 2019-06-10 DIAGNOSIS — M545 Low back pain, unspecified: Secondary | ICD-10-CM

## 2019-06-10 DIAGNOSIS — G8929 Other chronic pain: Secondary | ICD-10-CM | POA: Insufficient documentation

## 2019-06-10 DIAGNOSIS — M533 Sacrococcygeal disorders, not elsewhere classified: Secondary | ICD-10-CM | POA: Diagnosis present

## 2019-06-10 DIAGNOSIS — M25561 Pain in right knee: Secondary | ICD-10-CM | POA: Diagnosis present

## 2019-06-10 NOTE — Therapy (Addendum)
[place] MAIN [facility] SERVICES [address] [place], [place], [address] Phone: [phone]   Fax:  [phone]  Physical Therapy Evaluation  Patient Details  Name: Dawn Cook MRN: [id number] Date of Birth: Jun 28, 1971 Referring Provider (PT): Candelaria Stagers   Encounter Date: 06/10/2019    Past Medical History:  Diagnosis Date  . Endometriosis   . Sarcoidosis     Past Surgical History:  Procedure Laterality Date  . TONSILECTOMY, ADENOIDECTOMY, BILATERAL MYRINGOTOMY AND TUBES      There were no vitals filed for this visit.   Subjective Assessment - 06/14/19 2335    Subjective 1) Pelvic Pain: pt had pain with sexual intercourse prior to having children. Pain was tolerable and she was able to continue. Pain with pelvic exams with spectulum.  at worst 8/10       2) Tailbone pain: Pain existed in the past but the past 3 weeks, it has been 5/10. Sitting on bleachers at son's soccer games, sitting in her house, car., sitting on soft surface. Mutliple falls on her tailbone after learning how to snow board 20 years ago.    3)  SUI with sneezing. Denied pressure sensation/ prolapse Sx.  Currently running 1 miles every morning without stretching routine. Daily bowel movements without straining. 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 Denied excessive lifting. Pt did floor based  [employer] for years and she loved it. Pt notices the muiscles on the back of her legs are so tight.    4) LBP:  6/10.  Upper low back B radiating down low back. Started since 5 years ago , on and off. Pt just lost 10 pounds with plant based , no sugar, no alcohol. Pt plans ot maintain her current weight with no further intention for weight loss.    5)  R knee pain when she plays tennis or run.  4/10.  Sometimes it feels like there is an air bubble behind the knee cap.    6) Cramping in R flank: Sarcodosis with enlarged lymph nodes. Since a biopsy in 2009 on R  flank for testing lymph node, pt has experienced cramping in the flank. Cramping occurs with exercise.    Pertinent History  3 vaginal deliveries, with tearing. Family Hx of endometrial CA, endometriosis Dx in her 6s , treated with pills which has helped.         [facility] PT Assessment - 06/14/19 2315      Assessment   Medical Diagnosis  pelvic pain    Referring Provider (PT)  Kubinski      Precautions   Precautions  None      Restrictions   Weight Bearing Restrictions  No      Balance Screen   Has the patient fallen in the past 6 months  No      Observation/Other Assessments   Observations  crossing thighs when seated. increased supination on L foot, genuvalgus of R knee       Coordination   Gross Motor Movements are Fluid and Coordinated  --   chest breathing     Strength   Overall Strength Comments  hip flex with perturbation 4-/5, knee ext B 4+/5, knee flexion L 3+/5, R 4+/5, B hip abd 3+/5         Palpation   SI assessment   L iliac crest slightly higher,  R lumbar convex curve, R shoudler slightly higher, R scap downward rotation slightly delayed in standing.  in supine: L ASIS, malleoli lowered , R ASIS more anterior rotation      Palpation comment  tenderness at apex of sacrum, coccyx not flexed. tightness at sacrococcygeal ligament , lack of nutation , hypomobile R SIJ        Bed Mobility   Bed Mobility  --   head crunch               Objective measurements completed on examination: See above findings.      [facility] PT Treatment/Exercise - 06/14/19 2339      Bed Mobility   Bed Mobility  --   head crunch     Neuro Re-ed    Neuro Re-ed Details   cued for less R genu valgus, wider BOS in HEP, less glut overuse in prone heel press       Modalities   Modalities  Moist Heat      Moist Heat Therapy   Moist Heat Location  Other (comment)      Manual Therapy   Manual therapy comments  R long axis distraction, rotational mob at apex of scarum  R, superior glide in prone to faciliate more nutation of sacrum                   PT Long Term Goals - 06/10/19 1136      PT LONG TERM GOAL #1   Title  Pt will decrease score on [place] questionnaire from 11% to < 6% in order to improve QOL    Time  10    Period  Weeks    Status  New    Target Date  08/19/19      PT LONG TERM GOAL #2   Title  Pt will report decreased R knee pain by 50% with running / playing tennis    Time  6    Period  Weeks    Status  New      PT LONG TERM GOAL #3   Title  Pt will demo increased SIJ / sacral mobility and no pelvic obliquity across 2 weeks in order to progress to deep core coordination / strengthening    Time  2    Period  Weeks    Status  New      PT LONG TERM GOAL #4   Title  Pt will demo proper coordination of pelvic floor contraction with simulated coughing in order to minimzie SUI    Time  5    Period  Weeks    Status  New      PT LONG TERM GOAL #5   Title  Pt will report resolved tailbone pain in order to sit on bleachers watching children's sport and to progress to pelvic floor lengthening/ strengthening to minimize pelvic pain in functional acitivities    Time  8    Period  Weeks    Status  New      Additional Long Term Goals   Additional Long Term Goals  Yes      PT LONG TERM GOAL #6   Title  Pt will demo increased B LE strength overall to 5/5, demo improved running mechanics, no R genu valgus in SLS   in order to play tennis and run    Time  10    Period  Weeks    Status  New      PT LONG TERM GOAL #7   Title  Pt will demo decreased spinal mm tightness and  report of no R flank cramping in order to  exercise without pain    Time  5    Period  Weeks    Status  New             Plan - 06/14/19 2338    Clinical Impression Statement  Pt is a 48 yo female who reports of pelvic pain, tailbone pain, LBP, R knee pain, R flank cramping, and SUI. These Sx impact her QOL and ability to sit, run, exercise, and play  tennis.    Pt presents with pelvic obliquities, limited sacrum/ SIJ mobility, scoliosis, shoulder height discrepancy, BLE weakness, dyscoordination and strength of pelvic floor mm, genu valgus R > L,  poor body mechanics which places strain on the abdominal/pelvic floor mm. These are deficits that indicate an ineffective intraabdominal pressure system associated with increased risk for urinary leakage and orthopedic dysfunctions. Contributing factors include scoliosis, 3 vaginal deliveries, multiple falls onto her tailbone in her 82s, Hc of endometriosis.    Pt was provided education on etiology of Sx with anatomy, physiology explanation with images along with the benefits of customized pelvic PT Tx based on pt's medical conditions and musculoskeletal deficits.  Explained the physiology of deep core mm coordination and roles of pelvic floor function in urination, defecation, sexual function, and postural control with deep core mm system.   Biopsychosocial and regional interdependent approaches will yield greater benefits in pt's POC due to the complexity of her medical Hx and the significant impact their Sx have had on their QOL. P  Following Tx today which pt tolerated without complaints, pt demo'd equal alignment of pelvic girdle, increased sacral/ SIJ mobility, and increased spinal mobility.         Personal Factors and Comorbidities  Comorbidity 2    Comorbidities  Endometriosis since her 23s with pills as the only treatment which has helped. Cyst was present in her 35s. 3 vaginal deliveries with little tearing. Mutliple falls on her tailbone after learning how to snow board 20 years ago.    Examination-Activity Limitations  Sit;Continence;Other    Stability/Clinical Decision Making  Evolving/Moderate complexity    Rehab Potential  Good    PT Frequency  1x / week    PT Duration  Other (comment)   10   PT Treatment/Interventions  Neuromuscular re-education;Patient/family education;Manual  techniques;Scar mobilization;Therapeutic exercise;Therapeutic activities;Gait training;Stair training;Moist Heat;Traction;Taping    Consulted and Agree with Plan of Care  Patient       Patient will benefit from skilled therapeutic intervention in order to improve the following deficits and impairments:  Decreased activity tolerance, Decreased endurance, Decreased safety awareness, Decreased range of motion, Increased muscle spasms, Decreased balance, Decreased mobility, Decreased strength, Postural dysfunction, Improper body mechanics, Pain, Decreased coordination, Hypomobility, Abnormal gait  Visit Diagnosis: Sacrococcygeal disorders, not elsewhere classified  Chronic pain of right knee  Chronic low back pain without sciatica, unspecified back pain laterality  Pelvic pain     Problem List There are no active problems to display for this patient.   [name] ,PT, DPT, E-RYT  06/14/2019, 11:42 PM  [place] MAIN [facility] SERVICES [address] [place], [place], [address] Phone: [phone]   Fax:  [phone]  Name: CARLEIGH BUCCIERI MRN: [id number] Date of Birth: [birth year]

## 2019-06-10 NOTE — Patient Instructions (Signed)
In am and pm:      1)Frog stretch: laying on belly with pillow under hips, knees bent, inhale do nothing, exhale let ankles fall apart  30 reps     2) Prone Heel Press for strengthening sacro-iliac joints  1. Lie on your belly. If you have an arch in your low back or it feels umcomfortable, place a pillow under your low belly/hips to make sure your low back feel comfortable.   2. Place our forehead on top of your palms.      Widen your knees apart for starting position.   3. Inhale, feel belly and low back expand  4. Exhale, feel belly hug in, press heel together and count aloud for 5 sec. Then relax the heel squeezing.  Perform 10 reps of 5 sec holds. 2 sets/ day.    If you feel entire buttock tighten too much or feel low back pain, apply 50% less effort. As you press your heel together, you will feel as if your pubic bone (front of your pelvis) and sacrum (back of your pelvis) gentle move towards each other or your low abdominal muscles hug in more.   3) childs poses rocking with pillow under belly if need more support/ relaxation  childs poses rocking   Toes tucked, shoulders down and back, on forearms , hands shoulder width apart  10 reps     _______   During the day 2 x     1)  Standing:  10 reps on both sides x 3 x day     3 point tap   Feet are hip width Tap forward, center under hip not feet next to each other  Tap middle\, center  Tap back   * pay attention to when R  Leg is the supporting, down with the [place] of 1st, out with the thigh to align R knee more along 2-3rd toe line   2)   Standing version of childs pose rocking: At the counter    3) sit without crossing the thigh , feet on the ground hip width   _____

## 2019-06-14 NOTE — Addendum Note (Signed)
Addended by: [name] on: 06/14/2019 11:48 PM   Modules accepted: Orders

## 2019-06-16 ENCOUNTER — Other Ambulatory Visit: Payer: Self-pay

## 2019-06-16 ENCOUNTER — Ambulatory Visit: Payer: BLUE CROSS/BLUE SHIELD | Admitting: Physical Therapy

## 2019-06-16 DIAGNOSIS — G8929 Other chronic pain: Secondary | ICD-10-CM

## 2019-06-16 DIAGNOSIS — M533 Sacrococcygeal disorders, not elsewhere classified: Secondary | ICD-10-CM

## 2019-06-16 DIAGNOSIS — R102 Pelvic and perineal pain: Secondary | ICD-10-CM

## 2019-06-16 NOTE — Patient Instructions (Addendum)
Remove Prone heel press   Continue with 3 point tap   Add clam shells Clam Shell 45 Degrees   Lying with hips and knees bent 45, one pillow between knees and ankles. Lift knee with exhale. Be sure pelvis does not roll backward. Do not arch back. Do 30 times, each leg, 2 times per day.       Complimentary stretch: [employer] _ foot over _ thigh, opposite knee straight  5 breaths    Cross _ ankle overopposite thigh, seated twist
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
## 2019-06-21 ENCOUNTER — Encounter: Payer: BLUE CROSS/BLUE SHIELD | Admitting: Physical Therapy

## 2019-06-30 ENCOUNTER — Ambulatory Visit: Payer: BLUE CROSS/BLUE SHIELD | Admitting: Physical Therapy

## 2019-07-05 ENCOUNTER — Encounter: Payer: BLUE CROSS/BLUE SHIELD | Admitting: Physical Therapy

## 2019-07-06 ENCOUNTER — Other Ambulatory Visit: Payer: Self-pay

## 2019-07-06 ENCOUNTER — Ambulatory Visit: Payer: BLUE CROSS/BLUE SHIELD | Attending: Sports Medicine | Admitting: Physical Therapy

## 2019-07-06 DIAGNOSIS — M533 Sacrococcygeal disorders, not elsewhere classified: Secondary | ICD-10-CM | POA: Diagnosis present

## 2019-07-06 DIAGNOSIS — G8929 Other chronic pain: Secondary | ICD-10-CM | POA: Insufficient documentation

## 2019-07-06 DIAGNOSIS — R102 Pelvic and perineal pain: Secondary | ICD-10-CM | POA: Diagnosis present

## 2019-07-06 DIAGNOSIS — M545 Low back pain, unspecified: Secondary | ICD-10-CM

## 2019-07-06 DIAGNOSIS — M25561 Pain in right knee: Secondary | ICD-10-CM | POA: Diagnosis present
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
## 2019-07-06 NOTE — Patient Instructions (Addendum)
[email or web address]  Gloriann Loan podcast   ___ Anatomy/ physiology on pelvic floor function    ___ Body scan   Daily

## 2019-07-11 ENCOUNTER — Encounter: Payer: BLUE CROSS/BLUE SHIELD | Admitting: Physical Therapy

## 2019-07-14 ENCOUNTER — Ambulatory Visit: Payer: BLUE CROSS/BLUE SHIELD | Admitting: Physical Therapy

## 2019-07-14 ENCOUNTER — Other Ambulatory Visit: Payer: Self-pay

## 2019-07-14 ENCOUNTER — Telehealth: Payer: Self-pay | Admitting: Obstetrics and Gynecology

## 2019-07-14 DIAGNOSIS — R102 Pelvic and perineal pain: Secondary | ICD-10-CM

## 2019-07-14 DIAGNOSIS — M25561 Pain in right knee: Secondary | ICD-10-CM

## 2019-07-14 DIAGNOSIS — M533 Sacrococcygeal disorders, not elsewhere classified: Secondary | ICD-10-CM | POA: Diagnosis not present

## 2019-07-14 DIAGNOSIS — M545 Low back pain, unspecified: Secondary | ICD-10-CM

## 2019-07-14 DIAGNOSIS — G8929 Other chronic pain: Secondary | ICD-10-CM
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
## 2019-07-14 NOTE — Telephone Encounter (Signed)
The patient called and stated that she is wanting to have her orders placed now so she is able to have her annual labs done and ready by the time the pt comes in for an annual exam at the end of October. Pt prefers not to wait after annual to discuss labs. Pt is requesting a call back after labs have been placed. Please advise.

## 2019-07-14 NOTE — Patient Instructions (Addendum)
Long beach towel:  Happy baby pose     Figure-4     Cross over thigh      Hip socket      Hamstring  1 and 2       ITband       Quad    ___   Deep core level 1 and 2 ( handout)

## 2019-07-19 ENCOUNTER — Other Ambulatory Visit: Payer: Self-pay

## 2019-07-19 ENCOUNTER — Ambulatory Visit: Payer: BLUE CROSS/BLUE SHIELD | Admitting: Physical Therapy

## 2019-07-19 DIAGNOSIS — M545 Low back pain, unspecified: Secondary | ICD-10-CM

## 2019-07-19 DIAGNOSIS — G8929 Other chronic pain: Secondary | ICD-10-CM

## 2019-07-19 DIAGNOSIS — M533 Sacrococcygeal disorders, not elsewhere classified: Secondary | ICD-10-CM

## 2019-07-19 DIAGNOSIS — R102 Pelvic and perineal pain: Secondary | ICD-10-CM

## 2019-07-19 DIAGNOSIS — M25561 Pain in right knee: Secondary | ICD-10-CM

## 2019-07-19 NOTE — Therapy (Signed)
[place] MAIN [facility] SERVICES [address] [place], [place], [address] Phone: [phone]   Fax:  [phone]  Physical Therapy Treatment  Patient Details  Name: Dawn Cook MRN: [id number] Date of Birth: December 23, 1970 Referring Provider (PT): Candelaria Stagers   Encounter Date: 07/19/2019  PT End of Session - 07/19/19 1310    Visit Number  5    Number of Visits  10    Date for PT Re-Evaluation  08/19/19    PT Start Time  1307    PT Stop Time  9741    PT Time Calculation (min)  58 min    Activity Tolerance  Patient tolerated treatment well;No increased pain    Behavior During Therapy  WFL for tasks assessed/performed       Past Medical History:  Diagnosis Date  . Endometriosis   . Sarcoidosis     Past Surgical History:  Procedure Laterality Date  . TONSILECTOMY, ADENOIDECTOMY, BILATERAL MYRINGOTOMY AND TUBES      There were no vitals filed for this visit.  Subjective Assessment - 07/19/19 1311    Subjective  Pt remembered to stretch before and after running.    Pertinent History  3 vaginal deliveries, with tearing. Family Hx of endometrial CA, endometriosis Dx in her 64s , treated with pills which has helped.         [facility] PT Assessment - 07/19/19 1504      Strength   Overall Strength Comments  PF 8 reps 4/5, R, 4 reps 4/5 L with UE on wall   DF/EV 3+/5 B                Pelvic Floor Special Questions - 07/19/19 1458    External Perineal Exam  without undergarments     Skin Integrity  --   intact B dull/ sharp. slight redness over vulva (   External Palpation  tightness at deep / superficial transverse perineal  R> L         [facility] Adult PT Treatment/Exercise - 07/19/19 1456      Neuro Re-ed    Neuro Re-ed Details   cued for technique with new HEP      Moist Heat Therapy   Number Minutes Moist Heat  5 Minutes    Moist Heat Location  Other (comment)   perineum through sheet/ pillow      Manual Therapy   Manual therapy comments  L ischioanal fossa/ obt/ quadratus femoris R,        Therapeutic Activities: discussed pelvic external assessment, sensory system to pelvic floor            PT Long Term Goals - 06/10/19 1136      PT LONG TERM GOAL #1   Title  Pt will decrease score on [place] questionnaire from 11% to < 6% in order to improve QOL    Time  10    Period  Weeks    Status  New    Target Date  08/19/19      PT LONG TERM GOAL #2   Title  Pt will report decreased R knee pain by 50% with running / playing tennis    Time  6    Period  Weeks    Status  New      PT LONG TERM GOAL #3   Title  Pt will demo increased SIJ / sacral mobility and no pelvic obliquity across 2 weeks in order to progress to deep core  coordination / strengthening    Time  2    Period  Weeks    Status  New      PT LONG TERM GOAL #4   Title  Pt will demo proper coordination of pelvic floor contraction with simulated coughing in order to minimzie SUI    Time  5    Period  Weeks    Status  New      PT LONG TERM GOAL #5   Title  Pt will report resolved tailbone pain in order to sit on bleachers watching children's sport and to progress to pelvic floor lengthening/ strengthening to minimize pelvic pain in functional acitivities    Time  8    Period  Weeks    Status  New      Additional Long Term Goals   Additional Long Term Goals  Yes      PT LONG TERM GOAL #6   Title  Pt will demo increased B LE strength overall to 5/5, demo improved running mechanics, no R genu valgus in SLS   in order to play tennis and run    Time  10    Period  Weeks    Status  New      PT LONG TERM GOAL #7   Title  Pt will demo decreased spinal mm tightness and report of no R flank cramping in order to  exercise without pain    Time  5    Period  Weeks    Status  New            Plan - 07/19/19 1459    Clinical Impression Statement  Pt demo'd no more hamstring mm tightness. External pelvic floor assessment showed  R sided tightness which decreased post Tx. Added pelvic stretches into HEP. Pt demo'd increased supination in feet and feet weakness. Plan to increase feet/ankle strength to minimize overactivity of pelvic floor. Pt continues to benefit from skilled PT.    Personal Factors and Comorbidities  Comorbidity 2    Comorbidities  Endometriosis since her 25s with pills as the only treatment which has helped. Cyst was present in her 74s. 3 vaginal deliveries with little tearing. Mutliple falls on her tailbone after learning how to snow board 20 years ago.    Examination-Activity Limitations  Sit;Continence;Other    Stability/Clinical Decision Making  Evolving/Moderate complexity    Rehab Potential  Good    PT Frequency  1x / week    PT Duration  Other (comment)   10   PT Treatment/Interventions  Neuromuscular re-education;Patient/family education;Manual techniques;Scar mobilization;Therapeutic exercise;Therapeutic activities;Gait training;Stair training;Moist Heat;Traction;Taping    Consulted and Agree with Plan of Care  Patient       Patient will benefit from skilled therapeutic intervention in order to improve the following deficits and impairments:  Decreased activity tolerance, Decreased endurance, Decreased safety awareness, Decreased range of motion, Increased muscle spasms, Decreased balance, Decreased mobility, Decreased strength, Postural dysfunction, Improper body mechanics, Pain, Decreased coordination, Hypomobility, Abnormal gait  Visit Diagnosis: Sacrococcygeal disorders, not elsewhere classified  Chronic pain of right knee  Chronic low back pain without sciatica, unspecified back pain laterality  Pelvic pain     Problem List There are no active problems to display for this patient.   [name] ,PT, DPT, E-RYT  07/19/2019, 3:05 PM  [place] MAIN [facility] SERVICES [address] [place], [place], [address] Phone: [phone]   Fax:   [phone]  Name: Dawn Mchaney  Cook MRN: [id number] Date of Birth: 1971-07-03

## 2019-07-19 NOTE — Patient Instructions (Signed)
  Feet muscles strengthening   Pinky toe out  ( other foot placed hip width apart )  With band 30 reps seated    Heel raises 30 reps with hand on wall per day    ___  Stretch for pelvic floor with running stretches   "v heels slide away and then back toward buttocks and then rock knee to slight ,  slide heel along at 11 o clock away from buttocks   10 reps

## 2019-07-22 NOTE — Telephone Encounter (Signed)
LM for patient to return call.

## 2019-07-26 ENCOUNTER — Ambulatory Visit: Payer: BLUE CROSS/BLUE SHIELD | Admitting: Physical Therapy

## 2019-07-26 ENCOUNTER — Ambulatory Visit: Payer: BLUE CROSS/BLUE SHIELD | Attending: Sports Medicine | Admitting: Physical Therapy

## 2019-07-26 ENCOUNTER — Other Ambulatory Visit: Payer: Self-pay

## 2019-07-26 ENCOUNTER — Encounter: Payer: BLUE CROSS/BLUE SHIELD | Admitting: Physical Therapy

## 2019-07-26 DIAGNOSIS — R102 Pelvic and perineal pain: Secondary | ICD-10-CM | POA: Insufficient documentation

## 2019-07-26 DIAGNOSIS — M25561 Pain in right knee: Secondary | ICD-10-CM | POA: Diagnosis present

## 2019-07-26 DIAGNOSIS — G8929 Other chronic pain: Secondary | ICD-10-CM | POA: Diagnosis present

## 2019-07-26 DIAGNOSIS — M545 Low back pain, unspecified: Secondary | ICD-10-CM

## 2019-07-26 DIAGNOSIS — R279 Unspecified lack of coordination: Secondary | ICD-10-CM | POA: Insufficient documentation

## 2019-07-26 DIAGNOSIS — M533 Sacrococcygeal disorders, not elsewhere classified: Secondary | ICD-10-CM | POA: Insufficient documentation

## 2019-07-26 NOTE — Therapy (Addendum)
[place] MAIN [facility] SERVICES [address] [place], [place], [address] Phone: [phone]   Fax:  [phone]  Physical Therapy Treatment / progress note  Patient Details  Name: Dawn Cook MRN: [id number] Date of Birth: 23-Apr-1971 Referring Provider (PT): Candelaria Stagers   Encounter Date: 07/26/2019  PT End of Session - 07/26/19 1104    Visit Number  6    Number of Visits     Date for PT Re-Evaluation  10/04/2019     PT Start Time  1009    PT Stop Time  1104    PT Time Calculation (min)  55 min    Activity Tolerance  Patient tolerated treatment well;No increased pain    Behavior During Therapy  WFL for tasks assessed/performed       Past Medical History:  Diagnosis Date  . Endometriosis   . Sarcoidosis     Past Surgical History:  Procedure Laterality Date  . TONSILECTOMY, ADENOIDECTOMY, BILATERAL MYRINGOTOMY AND TUBES      There were no vitals filed for this visit.  Subjective Assessment - 07/26/19 1012    Subjective  Pt has been doing her stretches after she runs and she loves it    Pertinent History  3 vaginal deliveries, with tearing. Family Hx of endometrial CA, endometriosis Dx in her 23s , treated with pills which has helped.         [facility] PT Assessment - 07/26/19 1105      Observation/Other Assessments   Observations  L rays of foot adducted > R.        Single Leg Stance   Comments  ankle instability/ supination dominant      Strength   Overall Strength Comments  PF with single UE support , ankle supination B ( improved with cues)       Palpation   Palpation comment  tightness at dorsum/ plantar mm of L foot ( post Tx: decreased mm tensions/ increased pronation , mobility at midfoot joint L )                     [facility] Adult PT Treatment/Exercise - 07/26/19 1114      Neuro Re-ed    Neuro Re-ed Details   cued for less ankle supination in standing PF ascension, descension      Manual Therapy   Manual therapy comments  AP/PA along rays, medial glide at midfoot joints, mm releases at dorsal/ plantar surfaces to promote balance between supination/ pronation,                  PT Long Term Goals - 07/26/19 1014      PT LONG TERM GOAL #1   Title  Pt will decrease score on [place] questionnaire from 11% to < 6% in order to improve QOL  ( 10/6: 7%)   Time  10    Period  Weeks    Status  Partially Met      PT LONG TERM GOAL #2   Title  Pt will report decreased R knee pain by 50% with running / playing tennis ( 10/6: by 75%)    Time  6    Period  Weeks    Status  Achieved      PT LONG TERM GOAL #3   Title  Pt will demo increased SIJ / sacral mobility and no pelvic obliquity across 2 weeks in order to progress to deep core coordination / strengthening  Time  2    Period  Weeks    Status  Achieved      PT LONG TERM GOAL #4   Title  Pt will demo proper coordination of pelvic floor contraction with simulated coughing in order to minimzie SUI    Time  5    Period  Weeks    Status  On-going      PT LONG TERM GOAL #5   Title  Pt will report resolved tailbone pain in order to sit on bleachers watching children's sport and to progress to pelvic floor lengthening/ strengthening to minimize pelvic pain in functional acitivities    Time  8    Period  Weeks    Status  Achieved      PT LONG TERM GOAL #6   Title  Pt will demo increased B LE strength overall to 5/5, demo improved running mechanics, no R genu valgus in SLS, less supination   in order to play tennis and run    Time  10    Period  Weeks    Status  Revised      PT LONG TERM GOAL #7   Title  Pt will demo decreased spinal mm tightness and report of no R flank cramping in order to  exercise without pain    Time  5    Period  Weeks    Status  Achieved            Plan - 07/26/19 1104    Clinical Impression Statement Pt has achieved 4/7 goals across there past 5 visits.   Improvements include resolution of  tailbone/LBP pain,  significantly decreased pelvic pain and knee pain. These improvements have allowed pt to participate in sexual activities, tennis and running with less issues.  Pt's pelvic girdle alignment, posture, deep core mm, tightness of hip/back, leg mm  have been addressed successfully. Still withholding pelvic contractions ( kegel exercises) until pt demonstrates no more pelvic floor/hip/leg tightness.   Pt is progressing to remaining goals. Utilizing regional interdependent approach to address lower kinetic chain to minimize risk for injuries in fitness and sports activities and minimize overactivity of pelvic floor mm. Today addressed pt's supination/ high arches with manual Tx and pt demo'd improved transverse arch co-activation post Tx.  Pt continues to benefit from skilled PT.    Personal Factors and Comorbidities  Comorbidity 2    Comorbidities  Endometriosis since her 17s with pills as the only treatment which has helped. Cyst was present in her 48s. 3 vaginal deliveries with little tearing. Mutliple falls on her tailbone after learning how to snow board 20 years ago.    Examination-Activity Limitations  Sit;Continence;Other    Stability/Clinical Decision Making  Evolving/Moderate complexity    Rehab Potential  Good    PT Frequency  1x / week    PT Duration  Other (comment)   10   PT Treatment/Interventions  Neuromuscular re-education;Patient/family education;Manual techniques;Scar mobilization;Therapeutic exercise;Therapeutic activities;Gait training;Stair training;Moist Heat;Traction;Taping    Consulted and Agree with Plan of Care  Patient       Patient will benefit from skilled therapeutic intervention in order to improve the following deficits and impairments:  Decreased activity tolerance, Decreased endurance, Decreased safety awareness, Decreased range of motion, Increased muscle spasms, Decreased balance, Decreased mobility, Decreased strength, Postural dysfunction,  Improper body mechanics, Pain, Decreased coordination, Hypomobility, Abnormal gait  Visit Diagnosis: Sacrococcygeal disorders, not elsewhere classified  Chronic pain of right knee  Chronic low back pain without  sciatica, unspecified back pain laterality  Pelvic pain     Problem List There are no active problems to display for this patient.   [name] ,PT, DPT, E-RYT  07/26/2019, 11:44 AM  [place] MAIN [facility] SERVICES [address] [place], [place], [address] Phone: [phone]   Fax:  [phone]  Name: DESTYNI HOPPEL MRN: [id number] Date of Birth: [birth year]

## 2019-07-26 NOTE — Patient Instructions (Addendum)
Feet care :  Self -feet massage   Handshake : fingers between toes, moving ballmounds/toes back and forth several times while other hand anchors at arch. Do the same at the hind/mid foot.  Heel to toes upward to a letter Big Letter T strokes to spread ballmounds and toes, several times, pinch between webs of toes  Run finger tips along top of foot between long bones "comb between the bones"    Wiggle toes and spread them out when relaxing   ___  Seated:  ballmound slide - heel up as you slide back, lower the heel down and move forward with  4 corners down  ---

## 2019-08-02 ENCOUNTER — Encounter: Payer: BLUE CROSS/BLUE SHIELD | Admitting: Physical Therapy

## 2019-08-02 ENCOUNTER — Telehealth: Payer: Self-pay | Admitting: Obstetrics and Gynecology

## 2019-08-02 NOTE — Telephone Encounter (Signed)
Spoke with patient to let her know that Dr. Amalia Hailey will order labs the day of physical.

## 2019-08-02 NOTE — Telephone Encounter (Signed)
Patient called requesting to get her lab work done prior to her annual visit on 08/16/19. She would to have the results back to discuss them when she comes for that appointment. Thanks

## 2019-08-02 NOTE — Telephone Encounter (Signed)
Spoke with patient and let her know that Dr. Amalia Hailey will order labs the day of physical. HE does not order labs early.

## 2019-08-05 ENCOUNTER — Encounter: Payer: BLUE CROSS/BLUE SHIELD | Admitting: Physical Therapy

## 2019-08-05 ENCOUNTER — Other Ambulatory Visit: Payer: Self-pay

## 2019-08-05 ENCOUNTER — Ambulatory Visit: Payer: BLUE CROSS/BLUE SHIELD | Admitting: Physical Therapy

## 2019-08-05 DIAGNOSIS — M545 Low back pain, unspecified: Secondary | ICD-10-CM

## 2019-08-05 DIAGNOSIS — G8929 Other chronic pain: Secondary | ICD-10-CM

## 2019-08-05 DIAGNOSIS — M533 Sacrococcygeal disorders, not elsewhere classified: Secondary | ICD-10-CM | POA: Diagnosis not present

## 2019-08-05 DIAGNOSIS — M25561 Pain in right knee: Secondary | ICD-10-CM

## 2019-08-05 DIAGNOSIS — R102 Pelvic and perineal pain: Secondary | ICD-10-CM

## 2019-08-05 NOTE — Therapy (Addendum)
[place] MAIN [facility] SERVICES [address] [place], [place], [address] Phone: [phone]   Fax:  [phone]  Physical Therapy Treatment  Patient Details  Name: Dawn Cook MRN: [id number] Date of Birth: 01-01-1971 Referring Provider (PT): Candelaria Stagers   Encounter Date: 08/05/2019  PT End of Session - 08/05/19 8786    Visit Number  7    Date for PT Re-Evaluation  10/04/19    PT Start Time  1003    PT Stop Time  1059    PT Time Calculation (min)  56 min    Activity Tolerance  Patient tolerated treatment well;No increased pain    Behavior During Therapy  WFL for tasks assessed/performed       Past Medical History:  Diagnosis Date  . Endometriosis   . Sarcoidosis     Past Surgical History:  Procedure Laterality Date  . TONSILECTOMY, ADENOIDECTOMY, BILATERAL MYRINGOTOMY AND TUBES      There were no vitals filed for this visit.  Subjective Assessment - 08/05/19 1015    Subjective  Pt felt her L foot being sore in a good way after last session. It feels different when running.    Pertinent History  3 vaginal deliveries, with tearing. Family Hx of endometrial CA, endometriosis Dx in her 84s , treated with pills which has helped.         [facility] PT Assessment - 08/05/19 1308      Palpation   Palpation comment  tightness at dorsum/ plantar mm of L foot ( post Tx: decreased mm tensions/ increased pronation , mobility at midfoot joint L )                  Pelvic Floor Special Questions - 08/05/19 1310    External Palpation  pelvic floor contraction 3/5         [facility] Adult PT Treatment/Exercise - 08/05/19 1308      Therapeutic Activites    Other Therapeutic Activities  functional positions to minimize pelvic pain,       Neuro Re-ed    Neuro Re-ed Details   coordination of pelvic floor contraction and lengthening to miminize pain with insertion of speculum at OB/GYN appt and with sexual intercourse      Manual  Therapy   Manual therapy comments  AP/PA along rays, medial glide at midfoot joints, mm releases at dorsal/ plantar surfaces to promote balance between supination/ pronation,                  PT Long Term Goals - 07/26/19 1014      PT LONG TERM GOAL #1   Title  Pt will decrease score on [place] questionnaire from 11% to < 6% in order to improve QOL    Time  10    Period  Weeks    Status  New      PT LONG TERM GOAL #2   Title  Pt will report decreased R knee pain by 50% with running / playing tennis ( 10/6: by 75%)    Time  6    Period  Weeks    Status  Achieved      PT LONG TERM GOAL #3   Title  Pt will demo increased SIJ / sacral mobility and no pelvic obliquity across 2 weeks in order to progress to deep core coordination / strengthening    Time  2    Period  Weeks    Status  Achieved  PT LONG TERM GOAL #4   Title  Pt will demo proper coordination of pelvic floor contraction with simulated coughing in order to minimzie SUI    Time  5    Period  Weeks    Status  On-going      PT LONG TERM GOAL #5   Title  Pt will report resolved tailbone pain in order to sit on bleachers watching children's sport and to progress to pelvic floor lengthening/ strengthening to minimize pelvic pain in functional acitivities    Time  8    Period  Weeks    Status  Achieved      PT LONG TERM GOAL #6   Title  Pt will demo increased B LE strength overall to 5/5, demo improved running mechanics, no R genu valgus in SLS, less supination at ankle   in order to play tennis and run    Time  10    Period  Weeks    Status  Revised      PT LONG TERM GOAL #7   Title  Pt will demo decreased spinal mm tightness and report of no R flank cramping in order to  exercise without pain    Time  5    Period  Weeks    Status  Achieved            Plan - 08/05/19 1308    Clinical Impression Statement Pt is making progress with lower kinetic chain improvements for integration into sports/ fitness  activities with minimize risks for injuries nor relapse of SIJ issues.  Pt showed decreased L foot hypomobility as she also reports playing stronger tennis matches.   Required further manual Tx to better align the rays of L foot and promote stronger ankle stability. Pt was educated on functional positions to minimize pelvic pain and demo'd improved understanding.  Pt continues to benefit from skilled PT.     Personal Factors and Comorbidities  Comorbidity 2    Comorbidities  Endometriosis since her 68s with pills as the only treatment which has helped. Cyst was present in her 71s. 3 vaginal deliveries with little tearing. Mutliple falls on her tailbone after learning how to snow board 20 years ago.    Examination-Activity Limitations  Sit;Continence;Other    Stability/Clinical Decision Making  Evolving/Moderate complexity    Rehab Potential  Good    PT Frequency  1x / week    PT Duration  Other (comment)   10   PT Treatment/Interventions  Neuromuscular re-education;Patient/family education;Manual techniques;Scar mobilization;Therapeutic exercise;Therapeutic activities;Gait training;Stair training;Moist Heat;Traction;Taping    Consulted and Agree with Plan of Care  Patient       Patient will benefit from skilled therapeutic intervention in order to improve the following deficits and impairments:  Decreased activity tolerance, Decreased endurance, Decreased safety awareness, Decreased range of motion, Increased muscle spasms, Decreased balance, Decreased mobility, Decreased strength, Postural dysfunction, Improper body mechanics, Pain, Decreased coordination, Hypomobility, Abnormal gait  Visit Diagnosis: Sacrococcygeal disorders, not elsewhere classified  Chronic pain of right knee  Chronic low back pain without sciatica, unspecified back pain laterality  Pelvic pain     Problem List There are no active problems to display for this patient.   Dawn Cook ,PT, DPT,  E-RYT  08/05/2019, 1:12 PM  [place] MAIN [facility] SERVICES [address] [place], [place], [address] Phone: [phone]   Fax:  [phone]  Name: Dawn Cook MRN: [id number] Date of Birth: 1970-11-21

## 2019-08-05 NOTE — Patient Instructions (Signed)
Toe spacers when reading or watching TV   30 sec balance on pillow with one leg Spread the toes and be on ballmounds   ___  Add to the stretches :   Lunge with back knee bends , toes and heel straight 10 reps  Front knee above ankle , more weight on 1st ballmound   ___  Functional positions for less pelvic pain Exhale, quick squeeze Points of contact,

## 2019-08-08 ENCOUNTER — Other Ambulatory Visit: Payer: Self-pay | Admitting: Obstetrics and Gynecology

## 2019-08-15 ENCOUNTER — Ambulatory Visit: Payer: BLUE CROSS/BLUE SHIELD | Admitting: Physical Therapy

## 2019-08-16 ENCOUNTER — Encounter: Payer: BLUE CROSS/BLUE SHIELD | Admitting: Obstetrics and Gynecology

## 2019-08-16 NOTE — Telephone Encounter (Signed)
Patient needs appointment.

## 2019-08-23 ENCOUNTER — Ambulatory Visit: Payer: BLUE CROSS/BLUE SHIELD | Attending: Sports Medicine | Admitting: Physical Therapy

## 2019-08-23 ENCOUNTER — Other Ambulatory Visit: Payer: Self-pay

## 2019-08-23 DIAGNOSIS — G8929 Other chronic pain: Secondary | ICD-10-CM | POA: Insufficient documentation

## 2019-08-23 DIAGNOSIS — M533 Sacrococcygeal disorders, not elsewhere classified: Secondary | ICD-10-CM | POA: Diagnosis present

## 2019-08-23 DIAGNOSIS — M545 Low back pain, unspecified: Secondary | ICD-10-CM

## 2019-08-23 DIAGNOSIS — R102 Pelvic and perineal pain: Secondary | ICD-10-CM | POA: Diagnosis present

## 2019-08-23 DIAGNOSIS — M25561 Pain in right knee: Secondary | ICD-10-CM | POA: Diagnosis present

## 2019-08-23 NOTE — Therapy (Signed)
[place] MAIN [facility] SERVICES [address] [place], [place], [address] Phone: [phone]   Fax:  [phone]  Physical Therapy Treatment  Patient Details  Name: Dawn Cook MRN: [id number] Date of Birth: [birth year] Referring Provider (PT): Candelaria Stagers   Encounter Date: 08/23/2019  PT End of Session - 08/23/19 1054    Visit Number  8    Date for PT Re-Evaluation  10/04/19    PT Start Time  1009    PT Stop Time  1107    PT Time Calculation (min)  58 min    Activity Tolerance  Patient tolerated treatment well;No increased pain    Behavior During Therapy  WFL for tasks assessed/performed       Past Medical History:  Diagnosis Date  . Endometriosis   . Sarcoidosis     Past Surgical History:  Procedure Laterality Date  . TONSILECTOMY, ADENOIDECTOMY, BILATERAL MYRINGOTOMY AND TUBES      There were no vitals filed for this visit.  Subjective Assessment - 08/23/19 1012    Subjective Pt reports she applied quick pelvic contractions to minimize pelvic pain during sexual intercourse. Pt has a OB/GYN appt annual check up coming up and in the past she had spotting after the insertion of her spectulum.    Pertinent History  3 vaginal deliveries, with tearing. Family Hx of endometrial CA, endometriosis Dx in her 37s , treated with pills which has helped.         [facility] PT Assessment - 08/23/19 1021      Strength   Overall Strength Comments  PF 20 reps 4/5 B with UE on wall, no UE support  L 7 rep, 8 rep.  Static SLS on levelled ground 30 reps , uneven surface ( pillow) 30 sec B with single UE      DF/EV 3+/5 B      Palpation   Palpation comment  hypomobility at L intermediate / lateral cuneiform bones in L foot, increased tightness at transverse adductor hallucis head                    [facility] Adult PT Treatment/Exercise - 08/23/19 1246      Neuro Re-ed    Neuro Re-ed Details   more transverse arch co-activation B in step  up / down exercise       Exercises   Exercises  --   see pt isntructions to promote static and eccentric     Manual Therapy   Manual therapy comments  AP/PA along rays, medial glide at midfoot joints, mm releases at dorsal/ plantar surfaces to promote balance between supination/ pronation,                PT Long Term Goals - 08/23/19 1016      PT LONG TERM GOAL #1   Title  Pt will decrease score on [place] questionnaire from 11% to < 6% in order to improve QOL   (11/3 : 3%)    Time  10    Period  Weeks    Status  Achieved      PT LONG TERM GOAL #2   Title  Pt will report decreased R knee pain by 50% with running / playing tennis ( 10/6: by 75%)    Time  6    Period  Weeks    Status  Achieved      PT LONG TERM GOAL #3   Title  Pt will demo increased  SIJ / sacral mobility and no pelvic obliquity across 2 weeks in order to progress to deep core coordination / strengthening    Time  2    Period  Weeks    Status  Achieved      PT LONG TERM GOAL #4   Title  Pt will demo proper coordination of pelvic floor contraction with simulated coughing in order to minimzie SUI    Time  5    Period  Weeks    Status  On-going      PT LONG TERM GOAL #5   Title  Pt will report resolved tailbone pain in order to sit on bleachers watching children's sport and to progress to pelvic floor lengthening/ strengthening to minimize pelvic pain in functional acitivities    Time  8    Period  Weeks    Status  Achieved      Additional Long Term Goals   Additional Long Term Goals  Yes      PT LONG TERM GOAL #6   Title  Pt will demo increased B LE strength overall to 5/5, demo improved running mechanics, no R genu valgus in SLS, less supination at ankle   in order to play tennis and run    Time  10    Period  Weeks    Status  Achieved      PT LONG TERM GOAL #7   Title  Pt will demo decreased spinal mm tightness and report of no R flank cramping in order to  exercise without pain    Time  5     Period  Weeks    Status  Achieved      PT LONG TERM GOAL #8   Title  Pt will increase PF reps without UE support from 7 reps B to > 20 reps in roder to play tennis with less risk for injuries    Time  8    Period  Weeks    Status  New    Target Date  10/18/19            Plan - 08/23/19 1056    Clinical Impression Statement Pt continued to benefit from from manual Tx to address feet deficits to promote less risk for lower kinetic chain deficits affecting pelvic pain/ SIJ/ knee pain and to promote pt playing her sports in tennis and running with less relapse of Sx. Pt achieved more mobility at midfoot joints in L foot post Tx.  Pt demo'd increased reps in closed kinetic chain plantar flexion strength with single UE support on wall and progressed to no UE support as an exercise in HEP. Progressed pt to static SLS on uneven surface to prepare pt for more hiking activities with family and better running mechanics.   Discussed preparation with gynecological exam with request for smaller speculum because in the past pt experienced pelvic pain and spotting after the use of a regular sized spectulum.    Reassessed goals and discussed remaining visits to help minimize risk for injuries in tennis and running and to increase balance, intrinsic feet strength to minimize overactivity of pelvic floor mm.   Pt continues to benefit from skilled PT with 2-4 more visits before d/c to achieve remaining goals.     Personal Factors and Comorbidities  Comorbidity 2    Comorbidities  Endometriosis since her 72s with pills as the only treatment which has helped. Cyst was present in her 80s. 3 vaginal deliveries with little tearing.  Mutliple falls on her tailbone after learning how to snow board 20 years ago.    Examination-Activity Limitations  Sit;Continence;Other    Stability/Clinical Decision Making  Evolving/Moderate complexity    Rehab Potential  Good    PT Frequency  1x / week    PT Duration  Other  (comment)   10   PT Treatment/Interventions  Neuromuscular re-education;Patient/family education;Manual techniques;Scar mobilization;Therapeutic exercise;Therapeutic activities;Gait training;Stair training;Moist Heat;Traction;Taping    Consulted and Agree with Plan of Care  Patient       Patient will benefit from skilled therapeutic intervention in order to improve the following deficits and impairments:  Decreased activity tolerance, Decreased endurance, Decreased safety awareness, Decreased range of motion, Increased muscle spasms, Decreased balance, Decreased mobility, Decreased strength, Postural dysfunction, Improper body mechanics, Pain, Decreased coordination, Hypomobility, Abnormal gait  Visit Diagnosis: Sacrococcygeal disorders, not elsewhere classified  Chronic pain of right knee  Chronic low back pain without sciatica, unspecified back pain laterality  Pelvic pain     Problem List There are no active problems to display for this patient.   [name] ,PT, DPT, E-RYT  08/23/2019, 12:47 PM  [place] MAIN [facility] SERVICES [address] [place], [place], [address] Phone: [phone]   Fax:  [phone]  Name: Dawn Cook MRN: [id number] Date of Birth: August 19, 1971

## 2019-08-23 NOTE — Patient Instructions (Signed)
Heel raises with eyeball ahead without hands on wall 10reps  Each  X 3 reps   ___  Heel raises with hand support 30 reps   ___  Static one leg stance  30 sec count   30 sec on pillow    ___  Step up and down same leg, slowly with the heel, push off/land on ballmounds  2 min

## 2019-08-25 ENCOUNTER — Encounter: Payer: Self-pay | Admitting: Obstetrics and Gynecology

## 2019-08-25 ENCOUNTER — Other Ambulatory Visit: Payer: Self-pay

## 2019-08-25 ENCOUNTER — Ambulatory Visit (INDEPENDENT_AMBULATORY_CARE_PROVIDER_SITE_OTHER): Payer: BLUE CROSS/BLUE SHIELD | Admitting: Obstetrics and Gynecology

## 2019-08-25 VITALS — BP 126/81 | HR 65 | Ht 70.0 in | Wt 131.7 lb

## 2019-08-25 DIAGNOSIS — Z01419 Encounter for gynecological examination (general) (routine) without abnormal findings: Secondary | ICD-10-CM

## 2019-08-25 DIAGNOSIS — Z13220 Encounter for screening for lipoid disorders: Secondary | ICD-10-CM | POA: Diagnosis not present

## 2019-08-25 DIAGNOSIS — Z1231 Encounter for screening mammogram for malignant neoplasm of breast: Secondary | ICD-10-CM

## 2019-08-25 NOTE — Progress Notes (Signed)
HPI:      [name] is a 48 y.o. [phone] who LMP was Patient's last menstrual period was 08/08/2019.  Subjective:   She presents today for her annual examination.  She continues to take OCPs and is doing well with them.  Has light menstrual periods each month.  She does have a history of endometriosis and the pills are used to suppress it. She does complain of a decreased libido which she has had for "20 years".  She has recently begun seeing a psychiatrist and pelvic floor therapist and is early in the process.    Hx: The following portions of the patient's history were reviewed and updated as appropriate:             She  has a past medical history of Endometriosis and Sarcoidosis. She does not have a problem list on file. She  has a past surgical history that includes Tonsilectomy, adenoidectomy, bilateral myringotomy and tubes. Her family history includes Breast cancer in her mother; Cancer in her maternal grandmother, maternal uncle, paternal aunt, paternal grandfather, paternal grandmother, and paternal uncle; Endometrial cancer in her mother. She  reports that she has never smoked. She has never used smokeless tobacco. She reports current alcohol use. She reports that she does not use drugs. She has a current medication list which includes the following prescription(s): norethindrone-ethinyl estradiol, azelastine, and fish oil. She has No Known Allergies.       Review of Systems:  Review of Systems  Constitutional: Denied constitutional symptoms, night sweats, recent illness, fatigue, fever, insomnia and weight loss.  Eyes: Denied eye symptoms, eye pain, photophobia, vision change and visual disturbance.  Ears/Nose/Throat/Neck: Denied ear, nose, throat or neck symptoms, hearing loss, nasal discharge, sinus congestion and sore throat.  Cardiovascular: Denied cardiovascular symptoms, arrhythmia, chest pain/pressure, edema, exercise intolerance, orthopnea and palpitations.   Respiratory: Denied pulmonary symptoms, asthma, pleuritic pain, productive sputum, cough, dyspnea and wheezing.  Gastrointestinal: Denied, gastro-esophageal reflux, melena, nausea and vomiting.  Genitourinary: See HPI for additional information.  Musculoskeletal: Denied musculoskeletal symptoms, stiffness, swelling, muscle weakness and myalgia.  Dermatologic: Denied dermatology symptoms, rash and scar.  Neurologic: Denied neurology symptoms, dizziness, headache, neck pain and syncope.  Psychiatric: Denied psychiatric symptoms, anxiety and depression.  Endocrine: Denied endocrine symptoms including hot flashes and night sweats.   Meds:   Current Outpatient Medications on File Prior to Visit  Medication Sig Dispense Refill  . norethindrone-ethinyl estradiol (BLISOVI FE 1/20) 1-20 MG-MCG tablet Take 1 tablet by mouth daily. 3 Package 1  . azelastine (ASTELIN) 0.1 % nasal spray Place into the nose.    . Omega-3 Fatty Acids (FISH OIL) 1000 MG CAPS Take by mouth.     No current facility-administered medications on file prior to visit.     Objective:     Vitals:   08/25/19 0816  BP: 126/81  Pulse: 65              Physical examination General NAD, Conversant  HEENT Atraumatic; Op clear with mmm.  Normo-cephalic. Pupils reactive. Anicteric sclerae  Thyroid/Neck Smooth without nodularity or enlargement. Normal ROM.  Neck Supple.  Skin No rashes, lesions or ulceration. Normal palpated skin turgor. No nodularity.  Breasts: No masses or discharge.  Symmetric.  No axillary adenopathy.  Lungs: Clear to auscultation.No rales or wheezes. Normal Respiratory effort, no retractions.  Heart: NSR.  No murmurs or rubs appreciated. No periferal edema  Abdomen: Soft.  Non-tender.  No masses.  No HSM. No  hernia  Extremities: Moves all appropriately.  Normal ROM for age. No lymphadenopathy.  Neuro: Oriented to PPT.  Normal mood. Normal affect.     Pelvic:   Vulva: Normal appearance.  No lesions.   Vagina: No lesions or abnormalities noted.  Support: Normal pelvic support.  Urethra No masses tenderness or scarring.  Meatus Normal size without lesions or prolapse.  Cervix: Normal appearance.  No lesions.  Anus: Normal exam.  No lesions.  Perineum: Normal exam.  No lesions.        Bimanual   Uterus: Normal size.  Non-tender.  Mobile.  AV.  Adnexae: No masses.  Non-tender to palpation.  Cul-de-sac: Negative for abnormality.      Assessment:    [id number] There are no active problems to display for this patient.    1. Well woman exam with routine gynecological exam   2. Encounter for screening mammogram for malignant neoplasm of breast   3. Lipid screening        Plan:            1.  Basic Screening Recommendations The basic screening recommendations for asymptomatic women were discussed with the patient during her visit.  The age-appropriate recommendations were discussed with her and the rational for the tests reviewed.  When I am informed by the patient that another primary care physician has previously obtained the age-appropriate tests and they are up-to-date, only outstanding tests are ordered and referrals given as necessary.  Abnormal results of tests will be discussed with her when all of her results are completed.  Routine preventative health maintenance measures emphasized: Exercise/Diet/Weight control, Tobacco Warnings, Alcohol/Substance use risks and Stress Management Mammogram ordered-labs performed today. Orders Orders Placed This Encounter  Procedures  . MM 3D SCREEN BREAST BILATERAL- standard of care screening mammo 3d  . HgB A1c  . Lipid Profile  . TSH    No orders of the defined types were placed in this encounter.       F/U  Return in about 1 year (around 08/24/2020) for Annual Physical.  Finis Bud, M.D. 08/25/2019 9:14 AM

## 2019-08-25 NOTE — Progress Notes (Signed)
Patient comes in today for yearly physical. She is due for labs and mammogram. She would also like labs and discuss low libido.

## 2019-08-26 LAB — LIPID PANEL
Chol/HDL Ratio: 4.5 ratio — ABNORMAL HIGH (ref 0.0–4.4)
Cholesterol, Total: 195 mg/dL (ref 100–199)
HDL: 43 mg/dL (ref >39)
LDL Chol Calc (NIH): 127 mg/dL — ABNORMAL HIGH (ref 0–99)
Triglycerides: 142 mg/dL (ref 0–149)
VLDL Cholesterol Cal: 25 mg/dL (ref 5–40)

## 2019-08-26 LAB — HEMOGLOBIN A1C
Est. average glucose Bld gHb Est-mCnc: 108 mg/dL
Hgb A1c MFr Bld: 5.4 % (ref 4.8–5.6)

## 2019-08-26 LAB — TSH: TSH: 1.78 u[IU]/mL (ref 0.450–4.500)

## 2019-09-06 ENCOUNTER — Ambulatory Visit: Payer: BLUE CROSS/BLUE SHIELD | Admitting: Physical Therapy

## 2019-09-20 ENCOUNTER — Ambulatory Visit: Payer: BLUE CROSS/BLUE SHIELD | Admitting: Physical Therapy

## 2019-09-26 ENCOUNTER — Ambulatory Visit
Admission: RE | Admit: 2019-09-26 | Discharge: 2019-09-26 | Disposition: A | Discharge status: Home / Self Care | Payer: BLUE CROSS/BLUE SHIELD | Source: Ambulatory Visit | Attending: Obstetrics and Gynecology | Admitting: Obstetrics and Gynecology

## 2019-09-26 DIAGNOSIS — Z1231 Encounter for screening mammogram for malignant neoplasm of breast: Secondary | ICD-10-CM | POA: Insufficient documentation

## 2019-10-04 ENCOUNTER — Encounter: Payer: BLUE CROSS/BLUE SHIELD | Admitting: Physical Therapy

## 2019-10-27 ENCOUNTER — Other Ambulatory Visit: Payer: Self-pay | Admitting: Obstetrics and Gynecology

## 2019-10-31 ENCOUNTER — Other Ambulatory Visit: Payer: Self-pay | Admitting: Obstetrics and Gynecology

## 2019-10-31 NOTE — Telephone Encounter (Signed)
Pt is needed a refill of her estradiol. Pt is requesting  a call once it done. Please advise

## 2019-11-01 ENCOUNTER — Telehealth: Payer: Self-pay

## 2019-11-01 MED ORDER — NORETHIN ACE-ETH ESTRAD-FE 1-20 MG-MCG PO TABS: 1.0000 | ORAL_TABLET | Freq: Every day | ORAL | 2 refills | Status: DC

## 2019-11-01 NOTE — Telephone Encounter (Signed)
Spoke with patient and she only got one package. I have resent in 3 packages with 2 refills.

## 2019-11-01 NOTE — Telephone Encounter (Signed)
Pt says Dr. Amalia Hailey generally fills her birth control for 1 year after physical. Wanted to know why she only rec 2 refills on prescription. Please call

## 2020-06-19 ENCOUNTER — Other Ambulatory Visit: Payer: Self-pay | Admitting: Obstetrics and Gynecology

## 2020-09-05 ENCOUNTER — Other Ambulatory Visit: Payer: Self-pay | Admitting: Obstetrics and Gynecology

## 2020-09-05 DIAGNOSIS — Z1231 Encounter for screening mammogram for malignant neoplasm of breast: Secondary | ICD-10-CM

## 2020-12-19 ENCOUNTER — Other Ambulatory Visit: Payer: Self-pay

## 2020-12-19 ENCOUNTER — Ambulatory Visit
Admission: RE | Admit: 2020-12-19 | Discharge: 2020-12-19 | Disposition: A | Discharge status: Home / Self Care | Payer: BLUE CROSS/BLUE SHIELD | Source: Ambulatory Visit | Attending: Obstetrics and Gynecology | Admitting: Obstetrics and Gynecology

## 2020-12-19 DIAGNOSIS — Z1231 Encounter for screening mammogram for malignant neoplasm of breast: Secondary | ICD-10-CM | POA: Diagnosis not present

## 2022-07-28 ENCOUNTER — Other Ambulatory Visit: Payer: Self-pay

## 2022-07-28 MED ORDER — NORETHIN ACE-ETH ESTRAD-FE 1-20 MG-MCG PO TABS: 1.0000 | ORAL_TABLET | Freq: Every day | ORAL | 1 refills | Status: AC

## 2022-08-07 IMAGING — MG MM DIGITAL SCREENING BILAT W/ TOMO AND CAD
8 series · 9 of 24 positions shown · non-contrast
Comparison: Previous exam(s).

CLINICAL DATA: Screening.

EXAM:
DIGITAL SCREENING BILATERAL MAMMOGRAM WITH TOMOSYNTHESIS AND CAD
TECHNIQUE: Bilateral screening digital craniocaudal and mediolateral oblique
mammograms were obtained. Bilateral screening digital breast
tomosynthesis was performed. The images were evaluated with
computer-aided detection.

[L CC synth-2D]
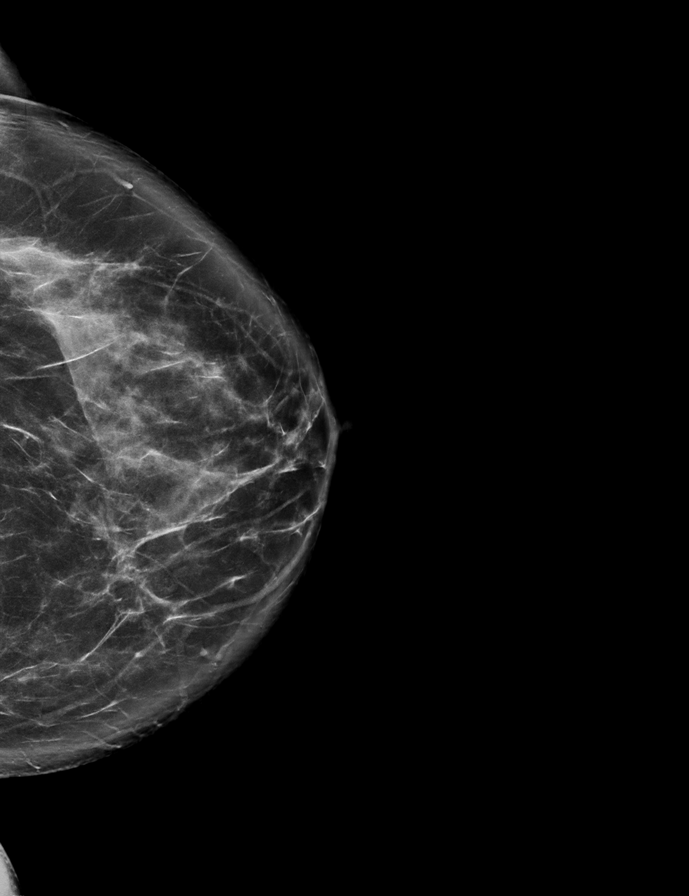

[R CC synth-2D]
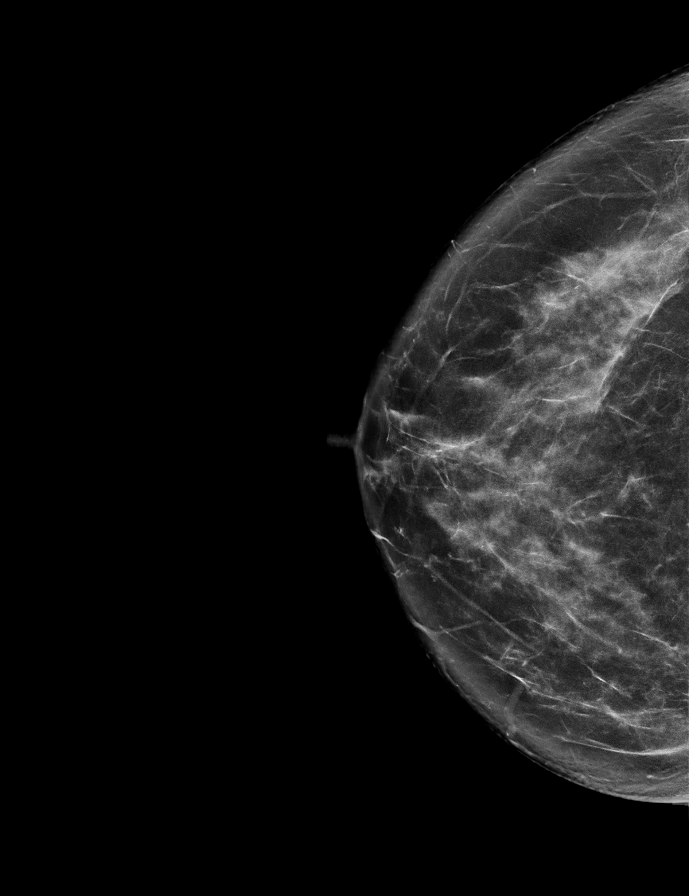

[R MLO synth-2D]
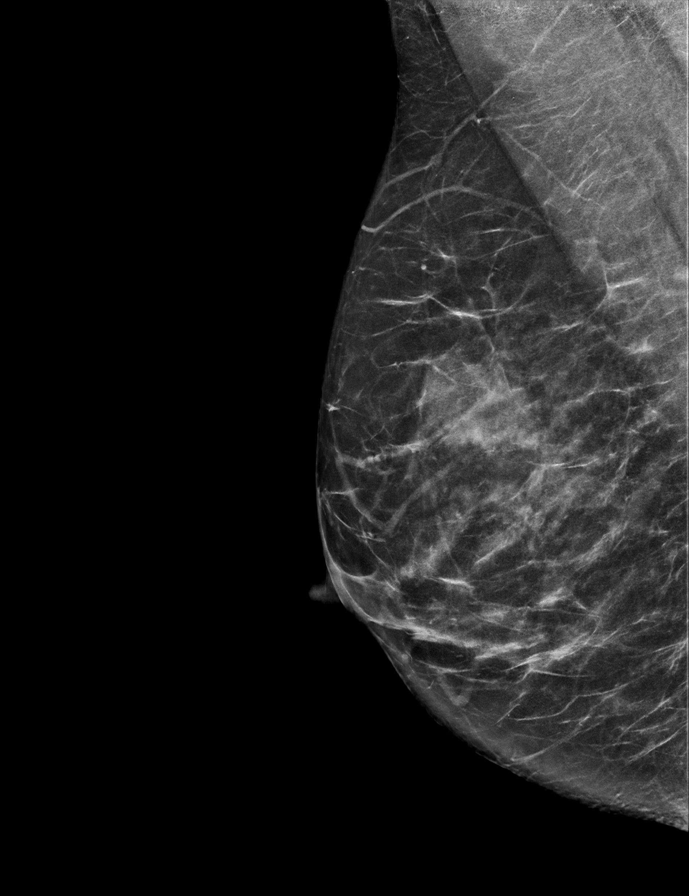

[L MLO synth-2D]
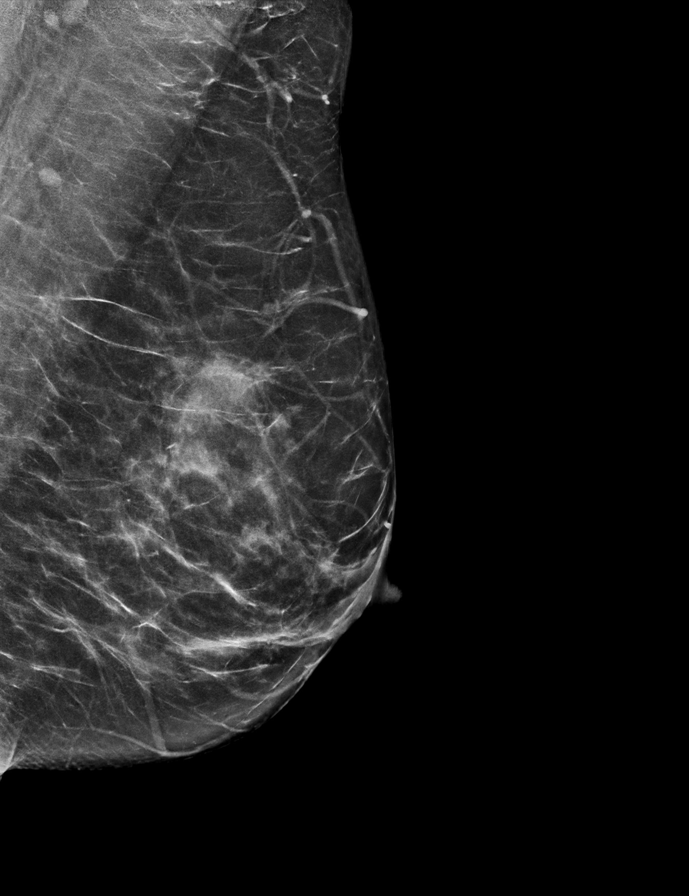

[R CC tomo · 2 of 74 frames shown]
[frame 24/74]
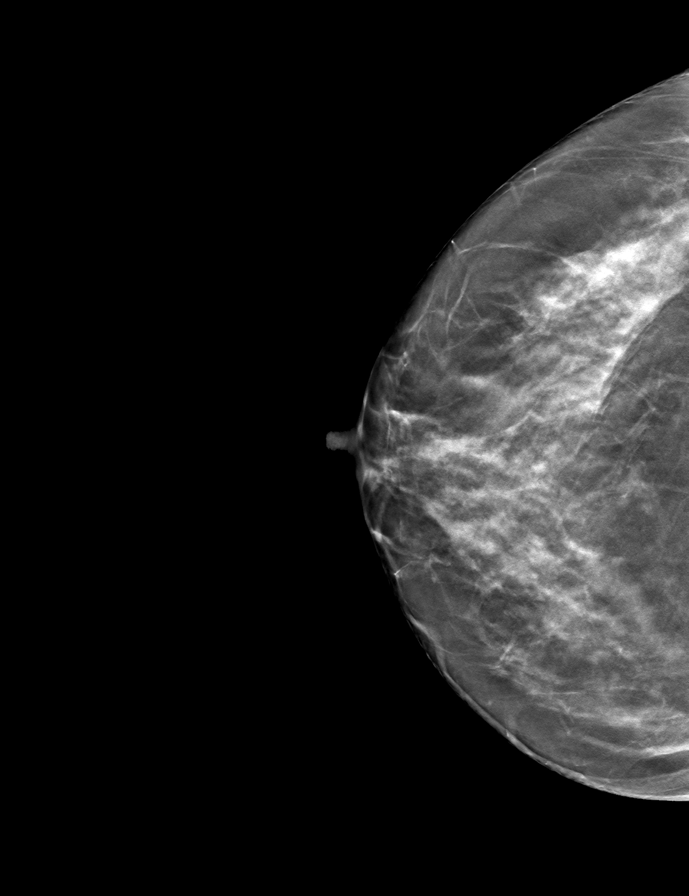
[frame 37/74]
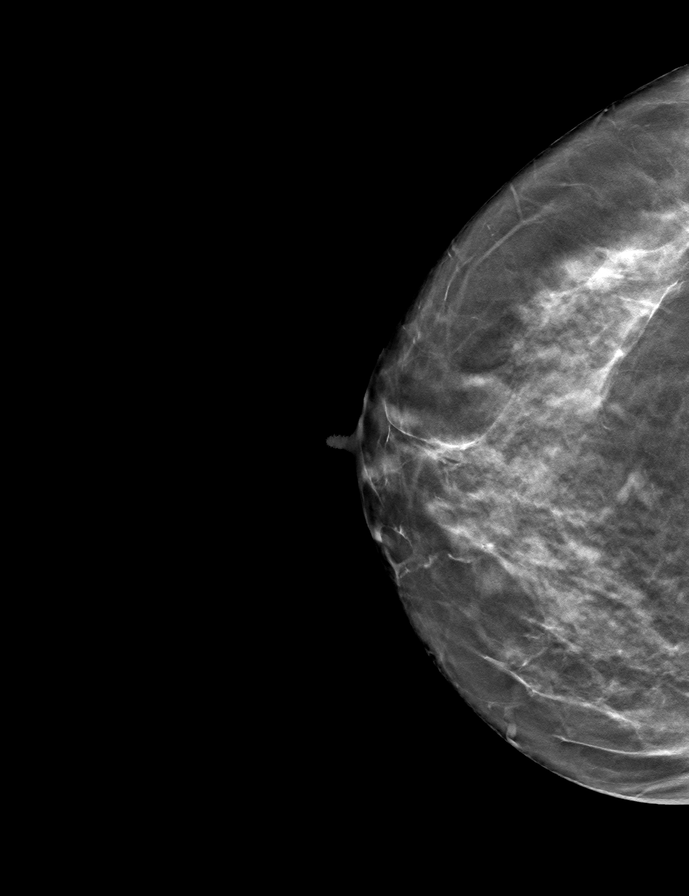

[L MLO tomo · tomo slice 33/64.0]
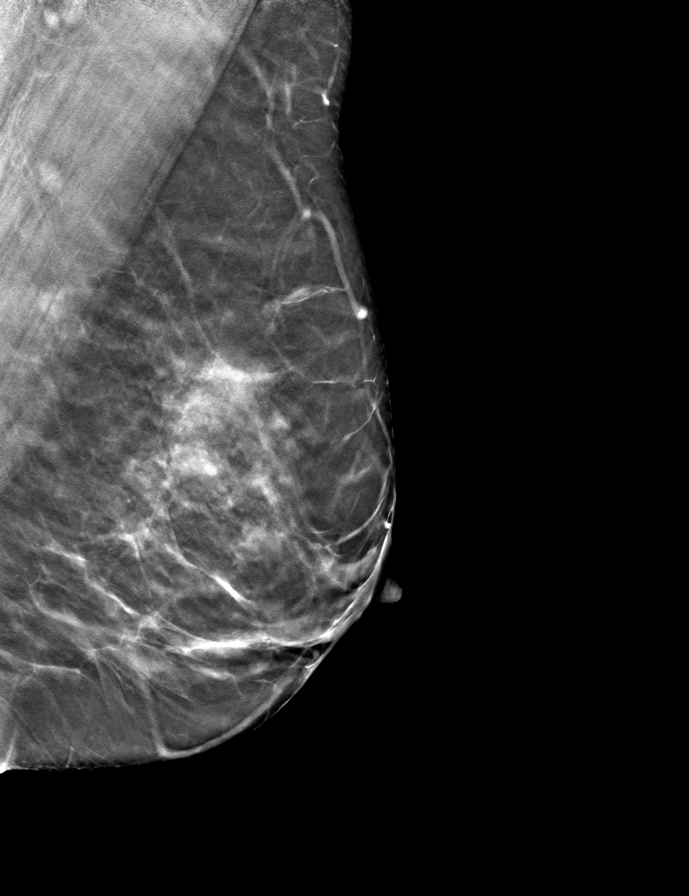

[L CC tomo · tomo slice 40/79.0]
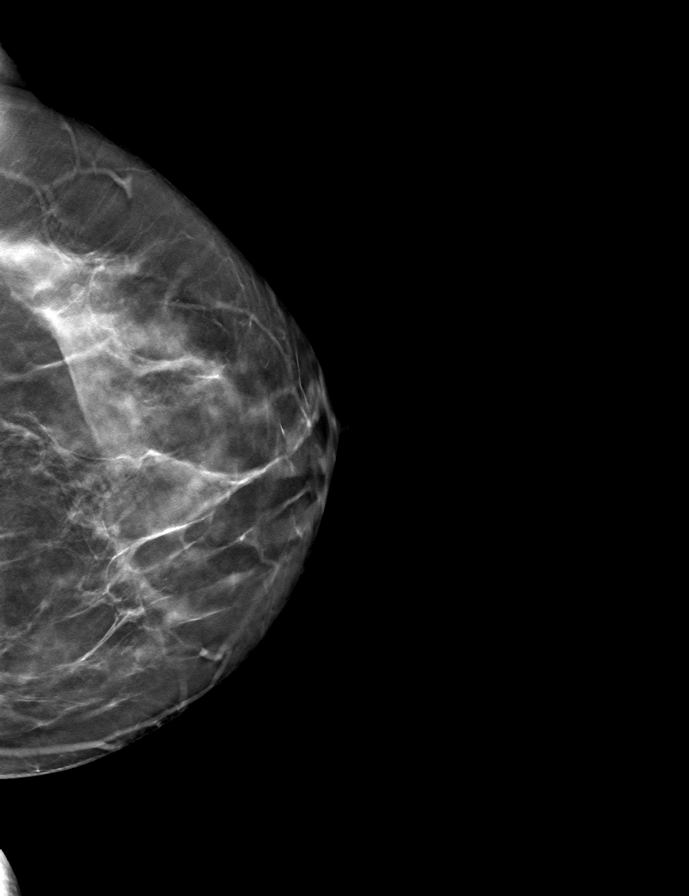

[R MLO tomo · tomo slice 34/67.0]
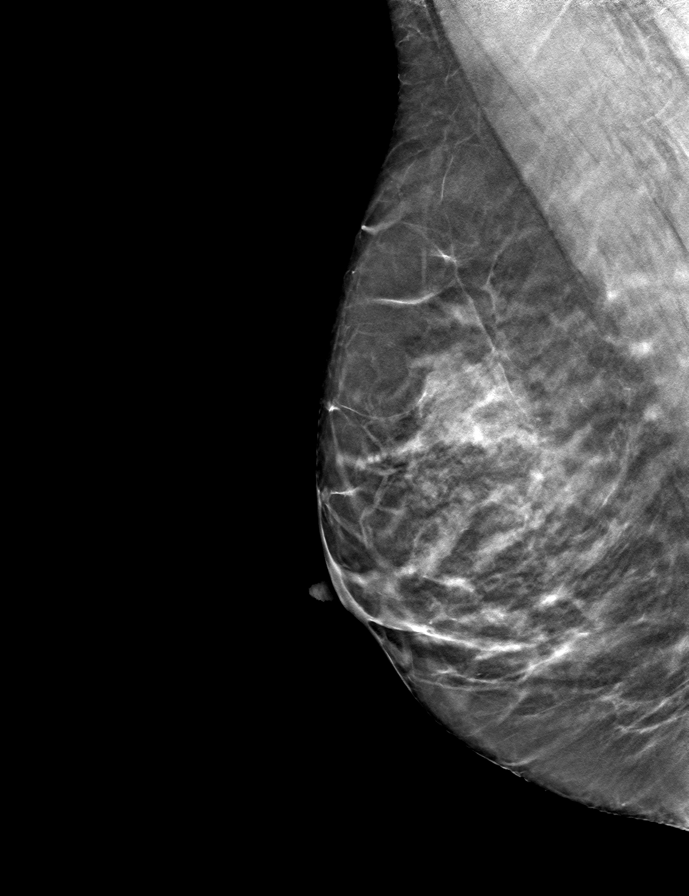

[9 of 24 positions shown; findings below may reference images not displayed]

ACR Breast Density Category c: The breast tissue is heterogeneously
dense, which may obscure small masses.
FINDINGS: There are no findings suspicious for malignancy. The images were
evaluated with computer-aided detection.
IMPRESSION: No mammographic evidence of malignancy. A result letter of this
screening mammogram will be mailed directly to the patient.

RECOMMENDATION:
Screening mammogram in one year. (Code:T4-5-GWO)

BI-RADS CATEGORY  1: Negative.

## 2023-01-15 ENCOUNTER — Other Ambulatory Visit: Payer: Self-pay | Admitting: Obstetrics and Gynecology

## 2023-01-17 ENCOUNTER — Other Ambulatory Visit: Payer: Self-pay | Admitting: Obstetrics and Gynecology

## 2023-05-14 ENCOUNTER — Observation Stay
Admission: EM | Admit: 2023-05-14 | Discharge: 2023-05-16 | Disposition: A | Discharge status: Home / Self Care | Payer: BLUE CROSS/BLUE SHIELD | Attending: Surgery | Admitting: Surgery

## 2023-05-14 ENCOUNTER — Other Ambulatory Visit: Payer: Self-pay

## 2023-05-14 ENCOUNTER — Emergency Department: Payer: BLUE CROSS/BLUE SHIELD

## 2023-05-14 DIAGNOSIS — K810 Acute cholecystitis: Secondary | ICD-10-CM | POA: Diagnosis not present

## 2023-05-14 DIAGNOSIS — R509 Fever, unspecified: Secondary | ICD-10-CM | POA: Diagnosis present

## 2023-05-14 LAB — COMPREHENSIVE METABOLIC PANEL
ALT: 16 U/L (ref 0–44)
AST: 17 U/L (ref 15–41)
Albumin: 3.4 g/dL — ABNORMAL LOW (ref 3.5–5.0)
Alkaline Phosphatase: 62 U/L (ref 38–126)
Anion gap: 6 (ref 5–15)
BUN: <5 mg/dL — ABNORMAL LOW (ref 6–20)
CO2: 25 mmol/L (ref 22–32)
Calcium: 8.7 mg/dL — ABNORMAL LOW (ref 8.9–10.3)
Chloride: 103 mmol/L (ref 98–111)
Creatinine, Ser: 0.66 mg/dL (ref 0.44–1.00)
GFR, Estimated: >60 mL/min (ref >60)
Glucose, Bld: 117 mg/dL — ABNORMAL HIGH (ref 70–99)
Potassium: 3.4 mmol/L — ABNORMAL LOW (ref 3.5–5.1)
Sodium: 134 mmol/L — ABNORMAL LOW (ref 135–145)
Total Bilirubin: 0.6 mg/dL (ref 0.3–1.2)
Total Protein: 7.3 g/dL (ref 6.5–8.1)

## 2023-05-14 LAB — CBC
HCT: 32.2 % — ABNORMAL LOW (ref 36.0–46.0)
Hemoglobin: 11.3 g/dL — ABNORMAL LOW (ref 12.0–15.0)
MCH: 30.7 pg (ref 26.0–34.0)
MCHC: 35.1 g/dL (ref 30.0–36.0)
MCV: 87.5 fL (ref 80.0–100.0)
Platelets: 301 10*3/uL (ref 150–400)
RBC: 3.68 MIL/uL — ABNORMAL LOW (ref 3.87–5.11)
RDW: 11.9 % (ref 11.5–15.5)
WBC: 9.4 10*3/uL (ref 4.0–10.5)
nRBC: 0 % (ref 0.0–0.2)

## 2023-05-14 LAB — LIPASE, BLOOD: Lipase: 35 U/L (ref 11–51)

## 2023-05-14 LAB — TSH: TSH: 2.848 u[IU]/mL (ref 0.350–4.500)

## 2023-05-14 LAB — MONONUCLEOSIS SCREEN: Mono Screen: NEGATIVE

## 2023-05-14 MED ORDER — SODIUM CHLORIDE 0.9 % IV SOLN: INTRAVENOUS | Status: DC

## 2023-05-14 MED ORDER — ONDANSETRON HCL 4 MG/2ML IJ SOLN
4.0000 mg | Freq: Once | INTRAMUSCULAR | Status: AC
  Administered 2023-05-14: 4 mg via INTRAVENOUS
  Filled 2023-05-14: qty 2

## 2023-05-14 MED ORDER — ONDANSETRON 4 MG PO TBDP: 4.0000 mg | ORAL_TABLET | Freq: Four times a day (QID) | ORAL | Status: DC | PRN

## 2023-05-14 MED ORDER — SODIUM CHLORIDE 0.9 % IV SOLN
2.0000 g | INTRAVENOUS | Status: DC
  Administered 2023-05-15 – 2023-05-16 (×2): 2 g via INTRAVENOUS
  Filled 2023-05-14 (×2): qty 20

## 2023-05-14 MED ORDER — ACETAMINOPHEN 325 MG PO TABS
650.0000 mg | ORAL_TABLET | Freq: Four times a day (QID) | ORAL | Status: DC | PRN
  Administered 2023-05-14 – 2023-05-15 (×2): 650 mg via ORAL
  Filled 2023-05-14 (×2): qty 2

## 2023-05-14 MED ORDER — PANTOPRAZOLE SODIUM 40 MG IV SOLR
40.0000 mg | Freq: Every day | INTRAVENOUS | Status: DC
  Administered 2023-05-15 (×2): 40 mg via INTRAVENOUS
  Filled 2023-05-14 (×2): qty 10

## 2023-05-14 MED ORDER — MORPHINE SULFATE (PF) 2 MG/ML IV SOLN: 2.0000 mg | INTRAVENOUS | Status: DC | PRN

## 2023-05-14 MED ORDER — HYDROCODONE-ACETAMINOPHEN 5-325 MG PO TABS
1.0000 | ORAL_TABLET | ORAL | Status: DC | PRN
  Administered 2023-05-15 (×2): 1 via ORAL
  Administered 2023-05-15 – 2023-05-16 (×3): 2 via ORAL
  Filled 2023-05-14 (×3): qty 2
  Filled 2023-05-14: qty 1
  Filled 2023-05-14: qty 2

## 2023-05-14 MED ORDER — KETOROLAC TROMETHAMINE 15 MG/ML IJ SOLN
15.0000 mg | Freq: Once | INTRAMUSCULAR | Status: AC
  Administered 2023-05-14: 15 mg via INTRAVENOUS
  Filled 2023-05-14: qty 1

## 2023-05-14 MED ORDER — TRAMADOL HCL 50 MG PO TABS: 50.0000 mg | ORAL_TABLET | Freq: Four times a day (QID) | ORAL | Status: DC | PRN

## 2023-05-14 MED ORDER — INDOCYANINE GREEN 25 MG IV SOLR
1.2500 mg | INTRAVENOUS | Status: AC
  Administered 2023-05-15: 1.25 mg via INTRAVENOUS

## 2023-05-14 MED ORDER — DOCUSATE SODIUM 100 MG PO CAPS: 100.0000 mg | ORAL_CAPSULE | Freq: Two times a day (BID) | ORAL | Status: DC | PRN

## 2023-05-14 MED ORDER — ONDANSETRON HCL 4 MG/2ML IJ SOLN
4.0000 mg | Freq: Four times a day (QID) | INTRAMUSCULAR | Status: DC | PRN
  Administered 2023-05-14: 4 mg via INTRAVENOUS
  Filled 2023-05-14: qty 2

## 2023-05-14 MED ORDER — DEXTROSE IN LACTATED RINGERS 5 % IV SOLN
1000.0000 mL | Freq: Once | INTRAVENOUS | Status: AC
  Administered 2023-05-14: 1000 mL via INTRAVENOUS

## 2023-05-14 MED ORDER — IOHEXOL 300 MG/ML  SOLN
100.0000 mL | Freq: Once | INTRAMUSCULAR | Status: AC | PRN
  Administered 2023-05-14: 100 mL via INTRAVENOUS

## 2023-05-14 NOTE — H&P (Signed)
Subjective:   CC: acute cholecystitis  HPI:  Dawn Cook is a 52 y.o. female who is consulted by [facility] for evaluation of above cc.  Symptoms were first noted 1 week ago. Pain started couple days ago.  Episode of pain lasting few hours after eating.  Initial symptoms were fever and fatigue.  Associated with above, exacerbated by nothing specific.  Pain more severe now.      Past Medical History:  has a past medical history of Endometriosis and Sarcoidosis.  Past Surgical History:  has a past surgical history that includes Tonsilectomy, adenoidectomy, bilateral myringotomy and tubes.  Family History: family history includes Breast cancer in her mother; Cancer in her maternal grandmother, maternal uncle, paternal aunt, paternal grandfather, paternal grandmother, and paternal uncle; Endometrial cancer in her mother.  Social History:  reports that she has never smoked. She has never used smokeless tobacco. She reports current alcohol use. She reports that she does not use drugs.  Current Medications:  Prior to Admission medications   Medication Sig Start Date End Date Taking? Authorizing Provider  azelastine (ASTELIN) 0.1 % nasal spray Place into the nose. 07/31/16 04/12/18  [provider]  norethindrone-ethinyl estradiol-FE (BLISOVI FE 1/20) 1-20 MG-MCG tablet Take 1 tablet by mouth daily. 07/28/22   Linzie Collin, MD  Omega-3 Fatty Acids (FISH OIL) 1000 MG CAPS Take by mouth.    [provider]    Allergies:  Allergies as of 05/14/2023   (No Known Allergies)    ROS:  General: Denies weight loss, weight gain, fatigue, fevers, chills, and night sweats. Eyes: Denies blurry vision, double vision, eye pain, itchy eyes, and tearing. Ears: Denies hearing loss, earache, and ringing in ears. Nose: Denies sinus pain, congestion, infections, runny nose, and nosebleeds. Mouth/throat: Denies hoarseness, sore throat, bleeding gums, and difficulty swallowing. Heart:  Denies chest pain, palpitations, racing heart, irregular heartbeat, leg pain or swelling, and decreased activity tolerance. Respiratory: Denies breathing difficulty, shortness of breath, wheezing, cough, and sputum. GI: Denies change in appetite, heartburn, nausea, vomiting, constipation, diarrhea, and blood in stool. GU: Denies difficulty urinating, pain with urinating, urgency, frequency, blood in urine. Musculoskeletal: Denies joint stiffness, pain, swelling, muscle weakness. Skin: Denies rash, itching, mass, tumors, sores, and boils Neurologic: Denies headache, fainting, dizziness, seizures, numbness, and tingling. Psychiatric: Denies depression, anxiety, difficulty sleeping, and memory loss. Endocrine: Denies heat or cold intolerance, and increased thirst or urination. Blood/lymph: Denies easy bruising, and swollen glands     Objective:     BP (!) 107/50   Pulse 81   Temp 98.5 F (36.9 C) (Oral)   Resp 20   Ht 5\' 10"  (1.778 m)   Wt 63.5 kg   SpO2 100%   BMI 20.09 kg/m    Constitutional :  alert, cooperative, appears stated age, and no distress  Lymphatics/Throat:  no asymmetry, masses, or scars  Respiratory:  clear to auscultation bilaterally  Cardiovascular:  regular rate and rhythm  Gastrointestinal: Soft, no guarding, focal TTP epigastric area .   Musculoskeletal: Steady movement  Skin: Cool and moist  Psychiatric: Normal affect, non-agitated, not confused       LABS:     Latest Ref Rng & Units 05/14/2023    2:43 PM 09/08/2018    9:33 AM 08/06/2017    9:34 AM  CMP  Glucose 70 - 99 mg/dL 098  77  93   BUN 6 - 20 mg/dL <5  8    Creatinine 1.19 - 1.00 mg/dL 1.47  0.77    Sodium 135 - 145 mmol/L 134  140    Potassium 3.5 - 5.1 mmol/L 3.4  4.5    Chloride 98 - 111 mmol/L 103  102    CO2 22 - 32 mmol/L 25  21    Calcium 8.9 - 10.3 mg/dL 8.7  9.2    Total Protein 6.5 - 8.1 g/dL 7.3     Total Bilirubin 0.3 - 1.2 mg/dL 0.6     Alkaline Phos 38 - 126 U/L 62      AST 15 - 41 U/L 17     ALT 0 - 44 U/L 16         Latest Ref Rng & Units 05/14/2023    2:43 PM 02/25/2015    4:22 PM  CBC  WBC 4.0 - 10.5 K/uL 9.4  9.4   Hemoglobin 12.0 - 15.0 g/dL 16.1  09.6   Hematocrit 36.0 - 46.0 % 32.2  39.4   Platelets 150 - 400 K/uL 301  294      RADS: CLINICAL DATA:  Epigastric pain, fever   EXAM: ULTRASOUND ABDOMEN LIMITED RIGHT UPPER QUADRANT   COMPARISON:  CT 05/14/2023   FINDINGS: Gallbladder:   Gallbladder wall thickening measuring up to 7 mm. No visible stones or sonographic Murphy sign.   Common bile duct:   Diameter: Normal caliber, 2 mm   Liver:   No focal lesion identified. Within normal limits in parenchymal echogenicity. Portal vein is patent on color Doppler imaging with normal direction of blood flow towards the liver.   Other: None.   IMPRESSION: Gallbladder wall thickening without visible stones or sonographic Murphy sign. This could reflect chronic cholecystitis. If further evaluation is felt warranted, nuclear medicine hepatobiliary scan may be helpful.     Electronically Signed   By: Charlett Nose M.D.   On: 05/14/2023 21:50  CLINICAL DATA:  Fever and abdominal pain.   EXAM: CT ABDOMEN AND PELVIS WITH CONTRAST   TECHNIQUE: Multidetector CT imaging of the abdomen and pelvis was performed using the standard protocol following bolus administration of intravenous contrast.   RADIATION DOSE REDUCTION: This exam was performed according to the departmental dose-optimization program which includes automated exposure control, adjustment of the mA and/or kV according to patient size and/or use of iterative reconstruction technique.   CONTRAST:  OMNIPAQUE IOHEXOL 300 MG/ML  SOLN   COMPARISON:  CT abdomen and pelvis 02/25/2015   FINDINGS: Lower chest: No acute abnormality.   Hepatobiliary: Mild gallbladder wall hyperenhancement and wall thickening. No significant pericholecystic fluid. No radiopaque stone.  Intrahepatic biliary dilation. The common bile duct is normal in caliber. Unremarkable liver parenchyma.   Pancreas: Unremarkable.   Spleen: Unremarkable.   Adrenals/Urinary Tract: Normal adrenal glands. No urinary calculi or hydronephrosis. Bladder is unremarkable.   Stomach/Bowel: Normal caliber large and small bowel. No bowel wall thickening. The appendix is normal.Stomach is within normal limits.   Vascular/Lymphatic: No significant vascular findings are present. No enlarged abdominal or pelvic lymph nodes.   Reproductive: Unremarkable.   Other: No free intraperitoneal fluid or air.   Musculoskeletal: No acute fracture.   IMPRESSION: 1. Mild gallbladder wall hyperenhancement and wall thickening without pericholecystic fluid. Intrahepatic biliary dilation. No radiopaque stone. Right upper quadrant ultrasound may be helpful for further evaluation for cholecystitis. 2. Otherwise no acute abnormality in the abdomen or pelvis.     Electronically Signed   By: Minerva Fester M.D.   On: 05/14/2023 19:59   Assessment:  Acute cholecystitis based on pain location now and imaging  Plan:      Discussed the risk of surgery including post-op infxn, seroma, biloma, chronic pain, poor-delayed wound healing, retained gallstone, conversion to open procedure, post-op SBO or ileus, and need for additional procedures to address said risks.  The risks of general anesthetic including MI, CVA, sudden death or even reaction to anesthetic medications also discussed. Alternatives include continued observation.  Benefits include possible symptom relief, prevention of complications including acute cholecystitis, pancreatitis.  Typical post operative recovery of 3-5 days rest, continued pain in area and incision sites, possible loose stools up to 4-6 weeks, also discussed.  The patient understands the risks, any and all questions were answered to the patient's satisfaction.  CLD, NPO after  MD, to OR. IVF, IV ABX in the meantime  labs/images/medications/previous chart entries reviewed personally and relevant changes/updates noted above.

## 2023-05-14 NOTE — ED Provider Notes (Signed)
[facility] Provider Note    Event Date/Time   First MD Initiated Contact with Patient 05/14/23 1556     (approximate)   History   Chief Complaint: Fever   HPI  Dawn Cook is a 52 y.o. female with history of sarcoidosis and endometriosis who comes ED complaining of fever, chills, night sweats for the past week.  Also reports that she has not been able to eat or drink due to loss of appetite.  Also complains of headache and neck soreness although she is able to move her neck freely.  Denies chest pain shortness of breath or cough.  Denies abdominal pain.  Does feel nauseated.  No dysuria.     Physical Exam   Triage Vital Signs: ED Triage Vitals  Encounter Vitals Group     BP 05/14/23 1438 109/74     Systolic BP Percentile --      Diastolic BP Percentile --      Pulse Rate 05/14/23 1438 73     Resp 05/14/23 1443 17     Temp 05/14/23 1438 98.4 F (36.9 C)     Temp Source 05/14/23 1438 Oral     SpO2 05/14/23 1438 98 %     Weight 05/14/23 1441 140 lb (63.5 kg)     Height 05/14/23 1441 5\' 10"  (1.778 m)     Head Circumference --      Peak Flow --      Pain Score 05/14/23 1441 4     Pain Loc --      Pain Education --      Exclude from Growth Chart --     Most recent vital signs: Vitals:   05/14/23 2300 05/14/23 2329  BP: 122/79 (!) 124/50  Pulse: 87 90  Resp: 20 20  Temp: (!) 101.5 F (38.6 C) (!) 100.8 F (38.2 C)  SpO2: 100% 98%    General: Awake, no distress.  CV:  Good peripheral perfusion.  Regular rate and rhythm Resp:  Normal effort.  Clear to auscultation bilaterally Abd:  No distention.  Soft nontender Other:  Dry oral mucosa.  No rash, no lower extremity edema   ED Results / Procedures / Treatments   Labs (all labs ordered are listed, but only abnormal results are displayed) Labs Reviewed  CBC - Abnormal; Notable for the following components:      Result Value   RBC 3.68 (*)    Hemoglobin 11.3 (*)    HCT 32.2  (*)    All other components within normal limits  COMPREHENSIVE METABOLIC PANEL - Abnormal; Notable for the following components:   Sodium 134 (*)    Potassium 3.4 (*)    Glucose, Bld 117 (*)    BUN <5 (*)    Calcium 8.7 (*)    Albumin 3.4 (*)    All other components within normal limits  TSH  MONONUCLEOSIS SCREEN  LIPASE, BLOOD  HIV ANTIBODY (ROUTINE TESTING W REFLEX)  BASIC METABOLIC PANEL  CBC  HEPATIC FUNCTION PANEL     EKG    RADIOLOGY CT abdomen pelvis interpreted by me, negative for obstruction or free air.  Radiology report reviewed suggesting thickened gallbladder wall.  Ultrasound right upper quadrant interpreted by me, shows markedly thickened gallbladder wall.  No pericholecystic fluid or emphysema.  Radiology report reviewed   PROCEDURES:  Procedures   MEDICATIONS ORDERED IN ED: Medications  traMADol (ULTRAM) tablet 50 mg (has no administration in time range)  HYDROcodone-acetaminophen (NORCO/VICODIN)  5-325 MG per tablet 1-2 tablet (has no administration in time range)  morphine (PF) 2 MG/ML injection 2 mg (has no administration in time range)  docusate sodium (COLACE) capsule 100 mg (has no administration in time range)  ondansetron (ZOFRAN-ODT) disintegrating tablet 4 mg (has no administration in time range)    Or  ondansetron (ZOFRAN) injection 4 mg (has no administration in time range)  0.9 %  sodium chloride infusion (has no administration in time range)  pantoprazole (PROTONIX) injection 40 mg (has no administration in time range)  indocyanine green (IC-GREEN) injection 1.25 mg (has no administration in time range)  cefTRIAXone (ROCEPHIN) 2 g in sodium chloride 0.9 % 100 mL IVPB (has no administration in time range)  acetaminophen (TYLENOL) tablet 650 mg (650 mg Oral Given 05/14/23 2311)  dextrose 5 % in lactated ringers infusion (0 mLs Intravenous Stopped 05/14/23 1725)  ondansetron (ZOFRAN) injection 4 mg (4 mg Intravenous Given 05/14/23 1634)   ketorolac (TORADOL) 15 MG/ML injection 15 mg (15 mg Intravenous Given 05/14/23 1952)  iohexol (OMNIPAQUE) 300 MG/ML solution 100 mL (100 mLs Intravenous Contrast Given 05/14/23 1943)     IMPRESSION / MDM / ASSESSMENT AND PLAN / ED COURSE  I reviewed the triage vital signs and the nursing notes.  DDx: Viral illness, dehydration, electrolyte abnormality, AKI, anemia, hypothyroidism, mononucleosis, less likely pancreatitis, cholecystitis, appendicitis  Patient's presentation is most consistent with acute presentation with potential threat to life or bodily function.  Patient presents with poor oral intake, fevers chills sweats, initial vital signs normal, exam nonfocal.  Not having any specific pain anywhere other than her headache.  Doubt meningitis encephalitis or intracranial sinus thrombosis.  Labs unremarkable.  Will give IV fluids and Zofran and reassess.   Clinical Course as of 05/14/23 2337  Thu May 14, 2023  1937 Having persistent malaise. Also now reports epigastric pain with some associated mild tenderness. No peritonitis. Will obtain CT. [PS]  2223 [place] again demonstrates thickened GB wall. Exam continues to evolve with fairly pronounced epigastric and RUQ ttp now. Case d/w gen surg Dr. Tonna Boehringer, who will eval.  [PS]  2255 Patient reevaluated with Dr. Tonna Boehringer.  Continues to have worsening of aminal pain.  I rechecked her temp and it is 101.5.  She is having chills.  Agrees to proceed with surgery for cholecystitis. [PS]    Clinical Course User Index [PS] Sharman Cheek, MD     FINAL CLINICAL IMPRESSION(S) / ED DIAGNOSES   Final diagnoses:  Acute cholecystitis     Rx / DC Orders   ED Discharge Orders     None        Note:  This document was prepared using Dragon voice recognition software and may include unintentional dictation errors.   Sharman Cheek, MD 05/14/23 2337

## 2023-05-14 NOTE — ED Triage Notes (Addendum)
Pt sts that she has been having a fever for the last six days. Pt sts that she was tested for COVID, FLU and RSV last Friday and was negative. Pt also sts that she she has a stiff neck and having head pain in her eyes.

## 2023-05-15 ENCOUNTER — Observation Stay: Payer: BLUE CROSS/BLUE SHIELD | Admitting: Anesthesiology

## 2023-05-15 ENCOUNTER — Encounter
Admission: EM | Disposition: A | Discharge status: Home / Self Care | Payer: Self-pay | Source: Home / Self Care | Attending: Emergency Medicine

## 2023-05-15 ENCOUNTER — Encounter: Payer: Self-pay | Admitting: Surgery

## 2023-05-15 LAB — TYPE AND SCREEN
ABO/RH(D): A POS
Antibody Screen: POSITIVE
Unit division: 0
Unit division: 0

## 2023-05-15 LAB — BPAM RBC
Blood Product Expiration Date: 202408122359
Blood Product Expiration Date: 202408132359
Unit Type and Rh: 6200
Unit Type and Rh: 6200

## 2023-05-15 LAB — CBC: nRBC: 0 % (ref 0.0–0.2)

## 2023-05-15 LAB — POCT PREGNANCY, URINE: Preg Test, Ur: NEGATIVE

## 2023-05-15 LAB — POCT URINE PREGNANCY: Preg Test, Ur: NEGATIVE

## 2023-05-15 LAB — ABO/RH: ABO/RH(D): A POS

## 2023-05-15 SURGERY — CHOLECYSTECTOMY, ROBOT-ASSISTED, LAPAROSCOPIC
Anesthesia: General

## 2023-05-15 MED ORDER — DEXAMETHASONE SODIUM PHOSPHATE 10 MG/ML IJ SOLN
INTRAMUSCULAR | Status: DC | PRN
  Administered 2023-05-15: 10 mg via INTRAVENOUS

## 2023-05-15 MED ORDER — SUGAMMADEX SODIUM 200 MG/2ML IV SOLN
INTRAVENOUS | Status: DC | PRN
  Administered 2023-05-15: 200 mg via INTRAVENOUS

## 2023-05-15 MED ORDER — PROPOFOL 500 MG/50ML IV EMUL
INTRAVENOUS | Status: DC | PRN
  Administered 2023-05-15: 155 ug/kg/min via INTRAVENOUS

## 2023-05-15 MED ORDER — INDOCYANINE GREEN 25 MG IV SOLR
INTRAVENOUS | Status: AC
  Filled 2023-05-15: qty 10

## 2023-05-15 MED ORDER — FENTANYL CITRATE (PF) 100 MCG/2ML IJ SOLN
25.0000 ug | INTRAMUSCULAR | Status: DC | PRN
  Administered 2023-05-15 (×2): 25 ug via INTRAVENOUS

## 2023-05-15 MED ORDER — PROPOFOL 10 MG/ML IV BOLUS
INTRAVENOUS | Status: DC | PRN
  Administered 2023-05-15: 150 mg via INTRAVENOUS
  Administered 2023-05-15: 30 mg via INTRAVENOUS

## 2023-05-15 MED ORDER — OXYCODONE HCL 5 MG PO TABS
5.0000 mg | ORAL_TABLET | Freq: Once | ORAL | Status: AC | PRN
  Administered 2023-05-15: 5 mg via ORAL

## 2023-05-15 MED ORDER — DEXMEDETOMIDINE HCL IN NACL 80 MCG/20ML IV SOLN
INTRAVENOUS | Status: DC | PRN
  Administered 2023-05-15 (×2): 8 ug via INTRAVENOUS
  Administered 2023-05-15: 4 ug via INTRAVENOUS

## 2023-05-15 MED ORDER — LACTATED RINGERS IV SOLN: INTRAVENOUS | Status: DC | PRN

## 2023-05-15 MED ORDER — FENTANYL CITRATE (PF) 100 MCG/2ML IJ SOLN
INTRAMUSCULAR | Status: DC | PRN
  Administered 2023-05-15 (×2): 50 ug via INTRAVENOUS

## 2023-05-15 MED ORDER — ONDANSETRON HCL 4 MG/2ML IJ SOLN
INTRAMUSCULAR | Status: DC | PRN
  Administered 2023-05-15: 4 mg via INTRAVENOUS

## 2023-05-15 MED ORDER — LIDOCAINE-EPINEPHRINE (PF) 1 %-1:200000 IJ SOLN
INTRAMUSCULAR | Status: DC | PRN
  Administered 2023-05-15: 20 mL via INTRAMUSCULAR

## 2023-05-15 MED ORDER — ROCURONIUM BROMIDE 100 MG/10ML IV SOLN
INTRAVENOUS | Status: DC | PRN
  Administered 2023-05-15: 50 mg via INTRAVENOUS

## 2023-05-15 MED ORDER — GLYCOPYRROLATE 0.2 MG/ML IJ SOLN
INTRAMUSCULAR | Status: DC | PRN
  Administered 2023-05-15: .2 mg via INTRAVENOUS

## 2023-05-15 MED ORDER — OXYCODONE HCL 5 MG/5ML PO SOLN: 5.0000 mg | Freq: Once | ORAL | Status: AC | PRN

## 2023-05-15 MED ORDER — FENTANYL CITRATE (PF) 100 MCG/2ML IJ SOLN
INTRAMUSCULAR | Status: AC
  Filled 2023-05-15: qty 2

## 2023-05-15 MED ORDER — MIDAZOLAM HCL 2 MG/2ML IJ SOLN
INTRAMUSCULAR | Status: AC
  Filled 2023-05-15: qty 2

## 2023-05-15 MED ORDER — LIDOCAINE HCL (CARDIAC) PF 100 MG/5ML IV SOSY
PREFILLED_SYRINGE | INTRAVENOUS | Status: DC | PRN
  Administered 2023-05-15: 100 mg via INTRAVENOUS

## 2023-05-15 SURGICAL SUPPLY — 52 items
ADH SKN CLS APL DERMABOND .7 (GAUZE/BANDAGES/DRESSINGS) ×2
ANCHOR TIS RET SYS 235ML (MISCELLANEOUS) ×2 IMPLANT
BAG PRESSURE INF REUSE 1000 (BAG) IMPLANT
BAG TISS RTRVL C235 10X14 (MISCELLANEOUS) ×2
BLADE SURG SZ11 CARB STEEL (BLADE) ×2 IMPLANT
CATH REDDICK CHOLANGI 4FR 50CM (CATHETERS) IMPLANT
CAUTERY HOOK MNPLR 1.6 DVNC XI (INSTRUMENTS) ×2 IMPLANT
CLIP LIGATING HEMO O LOK GREEN (MISCELLANEOUS) ×2 IMPLANT
DERMABOND ADVANCED .7 DNX12 (GAUZE/BANDAGES/DRESSINGS) ×2 IMPLANT
DRAPE ARM DVNC X/XI (DISPOSABLE) ×8 IMPLANT
DRAPE C-ARM XRAY 36X54 (DRAPES) IMPLANT
DRAPE COLUMN DVNC XI (DISPOSABLE) ×2 IMPLANT
ELECT CAUTERY BLADE 6.4 (BLADE) ×2 IMPLANT
ELECT REM PT RETURN 9FT ADLT (ELECTROSURGICAL) ×2
ELECTRODE REM PT RTRN 9FT ADLT (ELECTROSURGICAL) ×2 IMPLANT
FORCEPS BPLR 8 MD DVNC XI (FORCEP) ×2 IMPLANT
FORCEPS BPLR FENES DVNC XI (FORCEP) ×2 IMPLANT
FORCEPS PROGRASP DVNC XI (FORCEP) ×2 IMPLANT
GLOVE BIOGEL PI IND STRL 7.0 (GLOVE) ×4 IMPLANT
GLOVE SURG SYN 6.5 ES PF (GLOVE) ×8 IMPLANT
GLOVE SURG SYN 6.5 PF PI (GLOVE) ×4 IMPLANT
GOWN STRL REUS W/ TWL LRG LVL3 (GOWN DISPOSABLE) ×6 IMPLANT
GOWN STRL REUS W/TWL LRG LVL3 (GOWN DISPOSABLE) ×8
GRASPER SUT TROCAR 14GX15 (MISCELLANEOUS) IMPLANT
IRRIGATOR SUCT 8 DISP DVNC XI (IRRIGATION / IRRIGATOR) IMPLANT
IV NS 1000ML (IV SOLUTION)
IV NS 1000ML BAXH (IV SOLUTION) IMPLANT
KIT TURNOVER KIT A (KITS) ×2 IMPLANT
LABEL OR SOLS (LABEL) ×2 IMPLANT
MANIFOLD NEPTUNE II (INSTRUMENTS) ×2 IMPLANT
NDL HYPO 22X1.5 SAFETY MO (MISCELLANEOUS) ×2 IMPLANT
NDL INSUFFLATION 14GA 120MM (NEEDLE) ×2 IMPLANT
NEEDLE HYPO 22X1.5 SAFETY MO (MISCELLANEOUS) ×2 IMPLANT
NEEDLE INSUFFLATION 14GA 120MM (NEEDLE) ×2 IMPLANT
NS IRRIG 500ML POUR BTL (IV SOLUTION) ×2 IMPLANT
OBTURATOR OPTICAL STND 8 DVNC (TROCAR) ×2
OBTURATOR OPTICALSTD 8 DVNC (TROCAR) ×2 IMPLANT
PACK LAP CHOLECYSTECTOMY (MISCELLANEOUS) ×2 IMPLANT
PENCIL SMOKE EVACUATOR (MISCELLANEOUS) ×2 IMPLANT
SEAL UNIV 5-12 XI (MISCELLANEOUS) ×8 IMPLANT
SET TUBE SMOKE EVAC HIGH FLOW (TUBING) ×2 IMPLANT
SOL ELECTROSURG ANTI STICK (MISCELLANEOUS) ×2
SOLUTION ELECTROSURG ANTI STCK (MISCELLANEOUS) ×2 IMPLANT
SPIKE FLUID TRANSFER (MISCELLANEOUS) ×4 IMPLANT
SUT MNCRL 4-0 (SUTURE) ×4
SUT MNCRL 4-0 27XMFL (SUTURE) ×4
SUT VICRYL 0 UR6 27IN ABS (SUTURE) ×2 IMPLANT
SUTURE MNCRL 4-0 27XMF (SUTURE) ×4 IMPLANT
SYR 30ML LL (SYRINGE) IMPLANT
SYSTEM WECK SHIELD CLOSURE (TROCAR) IMPLANT
TRAP FLUID SMOKE EVACUATOR (MISCELLANEOUS) ×2 IMPLANT
WATER STERILE IRR 500ML POUR (IV SOLUTION) ×2 IMPLANT

## 2023-05-15 NOTE — Op Note (Signed)
Preoperative diagnosis:  acute and cholecystitis  Postoperative diagnosis: same as above  Procedure: Robotic assisted Laparoscopic Cholecystectomy.   Anesthesia: GETA   Surgeon: Sung Amabile  Specimen: Gallbladder  Complications: None  EBL: 15mL  Wound Classification: Clean Contaminated  Indications: see HPI  Findings: Critical view of safety noted Cystic duct and artery identified, ligated and divided, clips remained intact at end of procedure Adequate hemostasis  Description of procedure:  The patient was placed on the operating table in the supine position. SCDs placed, pre-op abx administered.  General anesthesia was induced and OG tube placed by anesthesia. A time-out was completed verifying correct patient, procedure, site, positioning, and implant(s) and/or special equipment prior to beginning this procedure. The abdomen was prepped and draped in the usual sterile fashion.    Veress needle was placed at the Palmer's point and insufflation was started after confirming a positive saline drop test and no immediate increase in abdominal pressure.  After reaching 15 mm, the Veress needle was removed and a 8 mm port was placed via optiview technique under umbilicus measured 20mm from gallbladder.  The abdomen was inspected and no abnormalities or injuries were found.  Under direct vision, ports were placed in the following locations: One 12 mm patient left of the umbilicus, 8cm from the optiviewed port, one 8 mm port placed to the patient right of the umbilical port 8 cm apart.  1 additional 8 mm port placed lateral to the 12mm port.  Once ports were placed, The table was placed in the reverse Trendelenburg position with the right side up. The Xi platform was brought into the operative field and docked to the ports successfully.  An endoscope was placed through the umbilical port, fenestrated grasper through the adjacent patient right port, prograsp to the far patient left port, and then  a hook cautery in the left port.  The dome of the gallbladder was grasped with prograsp, passed and retracted over the dome of the liver. Adhesions between the gallbladder and omentum, duodenum and transverse colon were lysed via hook cautery. The infundibulum was grasped with the fenestrated grasper and retracted toward the right lower quadrant. This maneuver exposed Calot's triangle. The peritoneum overlying the gallbladder infundibulum was then dissected  and the cystic duct and cystic artery identified.  Critical view of safety with the liver bed clearly visible behind the duct and artery with no additional structures noted.  The cystic duct and cystic artery clipped and divided close to the gallbladder.  some bleeding noted posterior to artery, which was additionally clipped and ligated   The gallbladder was then dissected from its peritoneal and liver bed attachments by electrocautery. Hemostasis was checked prior to removing the hook cautery and the Endo Catch bag was then placed through the 12 mm port and the gallbladder was removed.  The gallbladder was passed off the table as a specimen. There was no evidence of bleeding from the gallbladder fossa or cystic artery or leakage of the bile from the cystic duct stump. The 12 mm port site closed with PMI using 0 vicryl under direct vision.  Abdomen desufflated and secondary trocars were removed under direct vision. No bleeding was noted. All skin incisions then closed with subcuticular sutures of 4-0 monocryl and dressed with topical skin adhesive. The orogastric tube was removed and patient extubated.  The patient tolerated the procedure well and was taken to the postanesthesia care unit in stable condition.  All sponge and instrument count correct at end of procedure.

## 2023-05-15 NOTE — Interval H&P Note (Signed)
No change. OK to proceed.  Dawn Cook

## 2023-05-15 NOTE — Anesthesia Procedure Notes (Signed)
Procedure Name: General with mask airway Date/Time: 05/15/2023 2:26 PM  Performed by: Mohammed Kindle, CRNAPre-anesthesia Checklist: Patient identified, Emergency Drugs available, Suction available and Patient being monitored Patient Re-evaluated:Patient Re-evaluated prior to induction Oxygen Delivery Method: Circle system utilized Preoxygenation: Pre-oxygenation with 100% oxygen Induction Type: IV induction Laryngoscope Size: McGraph and 3 Grade View: Grade I Tube type: Oral Tube size: 6.5 mm Number of attempts: 1 Airway Equipment and Method: Stylet Placement Confirmation: ETT inserted through vocal cords under direct vision, positive ETCO2, CO2 detector and breath sounds checked- equal and bilateral Secured at: 21 cm Tube secured with: Tape Dental Injury: Teeth and Oropharynx as per pre-operative assessment

## 2023-05-15 NOTE — Transfer of Care (Signed)
Immediate Anesthesia Transfer of Care Note  Patient: Dawn Cook  Procedure(s) Performed: XI ROBOTIC ASSISTED LAPAROSCOPIC CHOLECYSTECTOMY INDOCYANINE GREEN FLUORESCENCE IMAGING (ICG)  Patient Location: PACU  Anesthesia Type:General  Level of Consciousness: drowsy  Airway & Oxygen Therapy: Patient Spontanous Breathing and Patient connected to face mask oxygen  Post-op Assessment: Report given to RN and Post -op Vital signs reviewed and stable  Post vital signs: Reviewed and stable  Last Vitals:  Vitals Value Taken Time  BP 103/60 05/15/23 1527  Temp    Pulse 80 05/15/23 1529  Resp 22 05/15/23 1529  SpO2 99 % 05/15/23 1529  Vitals shown include unfiled device data.  Last Pain:  Vitals:   05/15/23 1309  TempSrc: Temporal  PainSc: 4          Complications: No notable events documented.

## 2023-05-15 NOTE — Anesthesia Postprocedure Evaluation (Signed)
Anesthesia Post Note  Patient: KRISTAIN GARRISON  Procedure(s) Performed: XI ROBOTIC ASSISTED LAPAROSCOPIC CHOLECYSTECTOMY INDOCYANINE GREEN FLUORESCENCE IMAGING (ICG)  Patient location during evaluation: PACU Anesthesia Type: General Level of consciousness: awake and alert Pain management: pain level controlled Vital Signs Assessment: post-procedure vital signs reviewed and stable Respiratory status: spontaneous breathing, nonlabored ventilation, respiratory function stable and patient connected to nasal cannula oxygen Cardiovascular status: blood pressure returned to baseline and stable Postop Assessment: no apparent nausea or vomiting Anesthetic complications: no   No notable events documented.   Last Vitals:  Vitals:   05/15/23 1630 05/15/23 1652  BP: 107/66 120/68  Pulse: 62   Resp: 16 16  Temp: (!) 36.2 C   SpO2: 97% 96%    Last Pain:  Vitals:   05/15/23 1630  TempSrc:   PainSc: Asleep                 Corinda Gubler

## 2023-05-15 NOTE — Discharge Instructions (Signed)
Laparoscopic Cholecystectomy, Care After This sheet gives you information about how to care for yourself after your procedure. Your doctor may also give you more specific instructions. If you have problems or questions, contact your doctor. Follow these instructions at home: Care for cuts from surgery (incisions)  Follow instructions from your doctor about how to take care of your cuts from surgery. Make sure you: Wash your hands with soap and water before you change your bandage (dressing). If you cannot use soap and water, use hand sanitizer. Change your bandage as told by your doctor. Leave stitches (sutures), skin glue, or skin tape (adhesive) strips in place. They may need to stay in place for 2 weeks or longer. If tape strips get loose and curl up, you may trim the loose edges. Do not remove tape strips completely unless your doctor says it is okay. Do not take baths, swim, or use a hot tub until your doctor says it is okay. OK TO SHOWER 24HRS AFTER YOUR SURGERY.  Check your surgical cut area every day for signs of infection. Check for: More redness, swelling, or pain. More fluid or blood. Warmth. Pus or a bad smell. Activity Do not drive or use heavy machinery while taking prescription pain medicine. Do not play contact sports until your doctor says it is okay. Do not drive for 24 hours if you were given a medicine to help you relax (sedative). Rest as needed. Do not return to work or school until your doctor says it is okay. General instructions  tylenol and advil as needed for discomfort.  Please alternate between the two every four hours as needed for pain.    Use narcotics, if prescribed, only when tylenol and motrin is not enough to control pain.  325-650mg every 8hrs to max of 3000mg/24hrs (including the 325mg in every norco dose) for the tylenol.    Advil up to 800mg per dose every 8hrs as needed for pain.   To prevent or treat constipation while you are taking prescription  pain medicine, your doctor may recommend that you: Drink enough fluid to keep your pee (urine) clear or pale yellow. Take over-the-counter or prescription medicines. Eat foods that are high in fiber, such as fresh fruits and vegetables, whole grains, and beans. Limit foods that are high in fat and processed sugars, such as fried and sweet foods. Contact a doctor if: You develop a rash. You have more redness, swelling, or pain around your surgical cuts. You have more fluid or blood coming from your surgical cuts. Your surgical cuts feel warm to the touch. You have pus or a bad smell coming from your surgical cuts. You have a fever. One or more of your surgical cuts breaks open. You have trouble breathing. You have chest pain. You have pain that is getting worse in your shoulders. You faint or feel dizzy when you stand. You have very bad pain in your belly (abdomen). You are sick to your stomach (nauseous) for more than one day. You have throwing up (vomiting) that lasts for more than one day. You have leg pain. This information is not intended to replace advice given to you by your health care provider. Make sure you discuss any questions you have with your health care provider. Document Released: 07/15/2008 Document Revised: 04/26/2016 Document Reviewed: 03/24/2016 Elsevier Interactive Patient Education  2019 [employer]  

## 2023-05-15 NOTE — Anesthesia Preprocedure Evaluation (Signed)
Anesthesia Evaluation  Patient identified by MRN, date of birth, ID band Patient awake    Reviewed: Allergy & Precautions, NPO status , Patient's Chart, lab work & pertinent test results  History of Anesthesia Complications (+) PONV and history of anesthetic complications  Airway Mallampati: III  TM Distance: >3 FB Neck ROM: full    Dental  (+) Chipped, Dental Advidsory Given   Pulmonary    Pulmonary exam normal        Cardiovascular negative cardio ROS Normal cardiovascular exam     Neuro/Psych negative neurological ROS  negative psych ROS   GI/Hepatic negative GI ROS, Neg liver ROS,,,  Endo/Other  negative endocrine ROS    Renal/GU      Musculoskeletal   Abdominal   Peds  Hematology negative hematology ROS (+)   Anesthesia Other Findings PMH of sarcoidosis. Patient has no symptoms and states she feels fine today. Patient is not taking anything for management.   Past Medical History: No date: Endometriosis No date: Sarcoidosis  Past Surgical History: No date: TONSILECTOMY, ADENOIDECTOMY, BILATERAL MYRINGOTOMY AND TUBES  BMI    Body Mass Index: 20.72 kg/m      Reproductive/Obstetrics negative OB ROS                             Anesthesia Physical Anesthesia Plan  ASA: 2  Anesthesia Plan: General ETT   Post-op Pain Management:    Induction: Intravenous  PONV Risk Score and Plan: 4 or greater and Ondansetron, Dexamethasone, Propofol infusion, TIVA and Midazolam  Airway Management Planned: Oral ETT  Additional Equipment:   Intra-op Plan:   Post-operative Plan: Extubation in OR  Informed Consent: I have reviewed the patients History and Physical, chart, labs and discussed the procedure including the risks, benefits and alternatives for the proposed anesthesia with the patient or authorized representative who has indicated his/her understanding and acceptance.      Dental Advisory Given  Plan Discussed with: Anesthesiologist, CRNA and Surgeon  Anesthesia Plan Comments: (Patient consented for risks of anesthesia including but not limited to:  - adverse reactions to medications - damage to eyes, teeth, lips or other oral mucosa - nerve damage due to positioning  - sore throat or hoarseness - Damage to heart, brain, nerves, lungs, other parts of body or loss of life  Patient voiced understanding.)       Anesthesia Quick Evaluation

## 2023-05-16 DIAGNOSIS — K810 Acute cholecystitis: Secondary | ICD-10-CM | POA: Diagnosis not present

## 2023-05-16 LAB — CBC
HCT: 28.6 % — ABNORMAL LOW (ref 36.0–46.0)
Hemoglobin: 9.8 g/dL — ABNORMAL LOW (ref 12.0–15.0)
MCH: 30.4 pg (ref 26.0–34.0)
MCHC: 34.3 g/dL (ref 30.0–36.0)
MCV: 88.8 fL (ref 80.0–100.0)
Platelets: 348 10*3/uL (ref 150–400)
RBC: 3.22 MIL/uL — ABNORMAL LOW (ref 3.87–5.11)
RDW: 11.7 % (ref 11.5–15.5)
WBC: 9.4 10*3/uL (ref 4.0–10.5)
nRBC: 0 % (ref 0.0–0.2)

## 2023-05-16 LAB — CREATININE, SERUM
Creatinine, Ser: 0.63 mg/dL (ref 0.44–1.00)
GFR, Estimated: >60 mL/min (ref >60)

## 2023-05-16 MED ORDER — ENOXAPARIN SODIUM 40 MG/0.4ML IJ SOSY
40.0000 mg | PREFILLED_SYRINGE | INTRAMUSCULAR | Status: DC
  Administered 2023-05-16: 40 mg via SUBCUTANEOUS
  Filled 2023-05-16: qty 0.4

## 2023-05-16 MED ORDER — DOCUSATE SODIUM 100 MG PO CAPS: 100.0000 mg | ORAL_CAPSULE | Freq: Two times a day (BID) | ORAL | 0 refills | Status: AC | PRN

## 2023-05-16 MED ORDER — OXYCODONE-ACETAMINOPHEN 5-325 MG PO TABS: 1.0000 | ORAL_TABLET | Freq: Three times a day (TID) | ORAL | 0 refills | Status: AC | PRN

## 2023-05-16 MED ORDER — IBUPROFEN 800 MG PO TABS: 800.0000 mg | ORAL_TABLET | Freq: Three times a day (TID) | ORAL | 0 refills | Status: AC | PRN

## 2023-05-16 MED ORDER — ACETAMINOPHEN 325 MG PO TABS: 650.0000 mg | ORAL_TABLET | Freq: Three times a day (TID) | ORAL | 0 refills | Status: AC | PRN

## 2023-05-16 NOTE — Plan of Care (Signed)
  Problem: Education: Goal: Knowledge of General Education information will improve Description: Including pain rating scale, medication(s)/side effects and non-pharmacologic comfort measures Outcome: Progressing   Problem: Health Behavior/Discharge Planning: Goal: Ability to manage health-related needs will improve Outcome: Progressing   Problem: Clinical Measurements: Goal: Will remain free from infection Outcome: Progressing Goal: Diagnostic test results will improve Outcome: Progressing   Problem: Nutrition: Goal: Adequate nutrition will be maintained Outcome: Progressing   Problem: Coping: Goal: Level of anxiety will decrease Outcome: Progressing   Problem: Elimination: Goal: Will not experience complications related to bowel motility Outcome: Progressing   Problem: Pain Managment: Goal: General experience of comfort will improve Outcome: Progressing

## 2023-05-16 NOTE — Progress Notes (Signed)
Discharge instructions reviewed with pt, states understanding, pt tolerated breakfast without issues, pt states that she feels fine and ready to dc, Dr Aleen Campi aware and ok with dc if pt feels ok and ready, family at bedside to transport pt home, pt with no complaints at dc

## 2023-05-16 NOTE — Discharge Summary (Addendum)
Patient ID: Dawn Cook MRN: [id number] DOB/AGE: 01/16/1971 52 y.o.  Admit date: 05/14/2023 Discharge date: 05/16/2023   Discharge Diagnoses:  Principal Problem:   Acute cholecystitis   Procedures:  Robotic assisted cholecystectomy  Hospital Course: Patient was admitted on 05/14/23 with acute cholecystitis and was taken to the OR for robotic assisted cholecystectomy on 7/26.  Post-operatively, the patient has done well, tolerating diet, ambulating, pain well controlled.  On exam, her abdomen is soft, non-distended, appropriately tender.  Incisions are clean, dry, intact.  OK to d/c home. Follow up with Dr. Tonna Boehringer in 2 weeks.  Consults: None  Disposition: Discharge disposition: 01-Home or Self Care       Discharge Instructions     Call MD for:  difficulty breathing, headache or visual disturbances   Complete by: As directed    Call MD for:  persistant nausea and vomiting   Complete by: As directed    Call MD for:  redness, tenderness, or signs of infection (pain, swelling, redness, odor or green/yellow discharge around incision site)   Complete by: As directed    Call MD for:  severe uncontrolled pain   Complete by: As directed    Call MD for:  temperature >100.4   Complete by: As directed    Diet general   Complete by: As directed    Regular diet as tolerated   Discharge instructions   Complete by: As directed    1.  Patient may shower, but do not scrub wounds heavily and dab dry only. 2.  Do not submerge wounds in pool/tub until fully healed. 3.  Do not apply ointments or hydrogen peroxide to the wounds. 4.  May apply ice packs to the wounds for comfort.   Driving Restrictions   Complete by: As directed    Do not drive while taking narcotics for pain control.  Prior to driving, make sure you are able to rotate right and left to look at blindspots without significant pain or discomfort.   Increase activity slowly   Complete by: As directed    Lifting restrictions    Complete by: As directed    No heavy lifting or pushing of more than 10-15 lbs for 4 weeks.   No dressing needed   Complete by: As directed       Allergies as of 05/16/2023   No Known Allergies      Medication List     TAKE these medications    acetaminophen 325 MG tablet Commonly known as: Tylenol Take 2 tablets (650 mg total) by mouth every 8 (eight) hours as needed for mild pain.   azelastine 0.1 % nasal spray Commonly known as: ASTELIN Place into the nose.   docusate sodium 100 MG capsule Commonly known as: Colace Take 1 capsule (100 mg total) by mouth 2 (two) times daily as needed for up to 10 days for mild constipation.   Fish Oil 1000 MG Caps Take by mouth.   ibuprofen 800 MG tablet Commonly known as: ADVIL Take 1 tablet (800 mg total) by mouth every 8 (eight) hours as needed for mild pain or moderate pain.   norethindrone-ethinyl estradiol-FE 1-20 MG-MCG tablet Commonly known as: Blisovi FE 1/20 Take 1 tablet by mouth daily.   oxyCODONE-acetaminophen 5-325 MG tablet Commonly known as: Percocet Take 1 tablet by mouth every 8 (eight) hours as needed for severe pain.               Discharge Care Instructions  (From admission,  onward)           Start     Ordered   05/16/23 0000  No dressing needed        05/16/23 1013            Follow-up Information     Maxwell, Isami, DO Follow up in 2 week(s).   Specialties: General Surgery, Surgery Why: post op lap chole Contact information: [address] [place] [address] [phone]                 I spent 35 minutes dedicated to the care of this patient on the date of this encounter to include pre-visit review of records, face-to-face time with the patient discussing diagnosis and management, and any post-visit coordination of care.  Howie Ill, MD [facility]
